# Patient Record
Sex: Female | Born: 1950 | Race: White | Hispanic: No | Marital: Married | Smoking: Current every day smoker
Health system: Southern US, Community
[De-identification: ages and names within clinical notes are randomized; demographics above are authoritative.]

## PROBLEM LIST (undated history)

## (undated) DIAGNOSIS — C73 Malignant neoplasm of thyroid gland: Secondary | ICD-10-CM

## (undated) DIAGNOSIS — Z720 Tobacco use: Secondary | ICD-10-CM

## (undated) DIAGNOSIS — F1010 Alcohol abuse, uncomplicated: Secondary | ICD-10-CM

## (undated) DIAGNOSIS — E039 Hypothyroidism, unspecified: Secondary | ICD-10-CM

## (undated) DIAGNOSIS — J449 Chronic obstructive pulmonary disease, unspecified: Secondary | ICD-10-CM

## (undated) HISTORY — DX: Chronic obstructive pulmonary disease, unspecified: J44.9

## (undated) HISTORY — PX: ABDOMINAL HYSTERECTOMY: SHX81

## (undated) HISTORY — PX: THYROID SURGERY: SHX805

---

## 2001-05-10 ENCOUNTER — Other Ambulatory Visit: Admission: RE | Admit: 2001-05-10 | Discharge: 2001-05-10 | Payer: Self-pay | Admitting: General Surgery

## 2001-12-12 ENCOUNTER — Encounter (INDEPENDENT_AMBULATORY_CARE_PROVIDER_SITE_OTHER): Payer: Self-pay | Admitting: *Deleted

## 2001-12-12 ENCOUNTER — Ambulatory Visit (HOSPITAL_BASED_OUTPATIENT_CLINIC_OR_DEPARTMENT_OTHER): Admission: RE | Admit: 2001-12-12 | Discharge: 2001-12-12 | Payer: Self-pay | Admitting: Orthopedic Surgery

## 2003-08-15 ENCOUNTER — Emergency Department (HOSPITAL_COMMUNITY): Admission: EM | Admit: 2003-08-15 | Discharge: 2003-08-15 | Payer: Self-pay | Admitting: Emergency Medicine

## 2004-01-08 ENCOUNTER — Ambulatory Visit (HOSPITAL_COMMUNITY): Admission: RE | Admit: 2004-01-08 | Discharge: 2004-01-08 | Payer: Self-pay | Admitting: Obstetrics & Gynecology

## 2005-05-05 ENCOUNTER — Ambulatory Visit (HOSPITAL_COMMUNITY): Admission: RE | Admit: 2005-05-05 | Discharge: 2005-05-05 | Payer: Self-pay | Admitting: Obstetrics & Gynecology

## 2005-06-29 ENCOUNTER — Ambulatory Visit (HOSPITAL_COMMUNITY): Admission: RE | Admit: 2005-06-29 | Discharge: 2005-06-29 | Payer: Self-pay | Admitting: Obstetrics & Gynecology

## 2007-04-06 IMAGING — US US BREAST*L*
1 series · 4 of 4 positions shown · non-contrast
Comparison: none

UNILATERAL  LEFT DIAGNOSTIC MAMMOGRAM AND LEFT BREAST ULTRASOUND:

Spot compression views of the lower outer quadrant of the left breast was obtained.  There is 
persistence of an obscured mass.  There is no associated malignant type microcalcifications.
On physical exam, I do not palpate a mass in the left breast.  Sonographically, a simple-appearing 
7 x 7 x 5 mm cyst is imaged at 5 o'clock approximately 4 cm from the nipple.  No solid mass or 
abnormal shadowing.

[Series 1: unknown · 0.05mm/px · 4 of 4 slices shown]
[im 1/4]
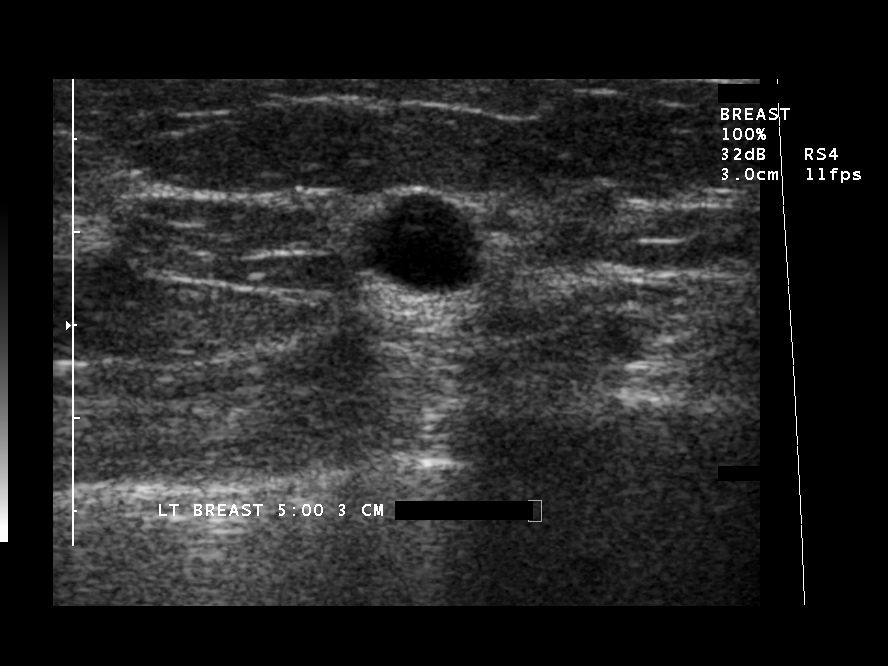
[im 2/4]
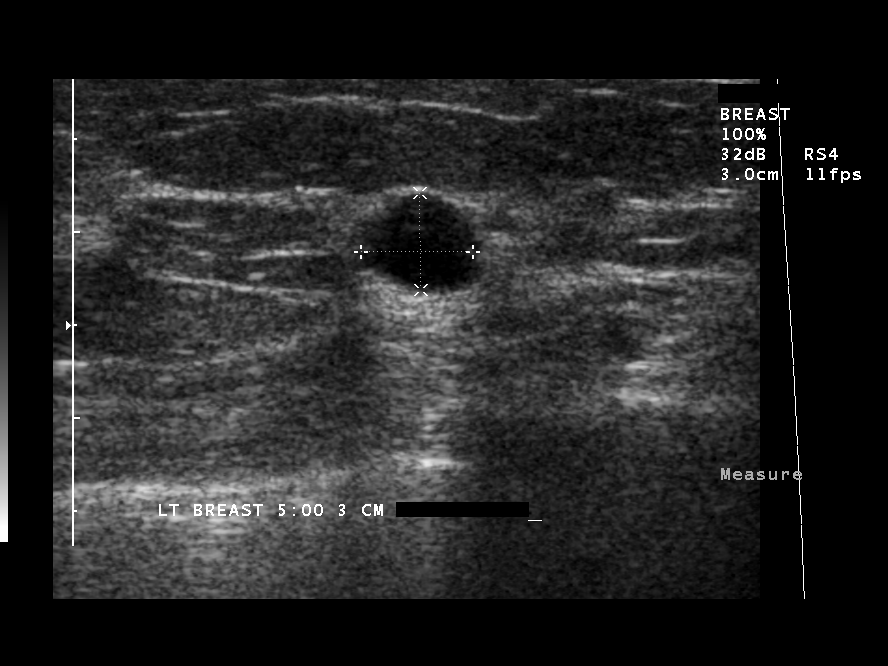
[im 3/4]
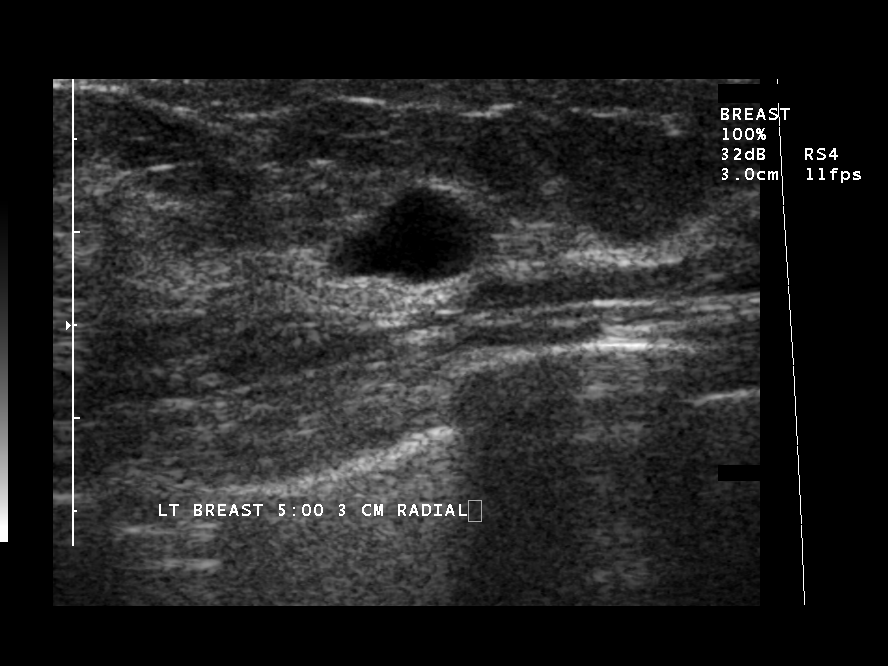
[im 4/4]
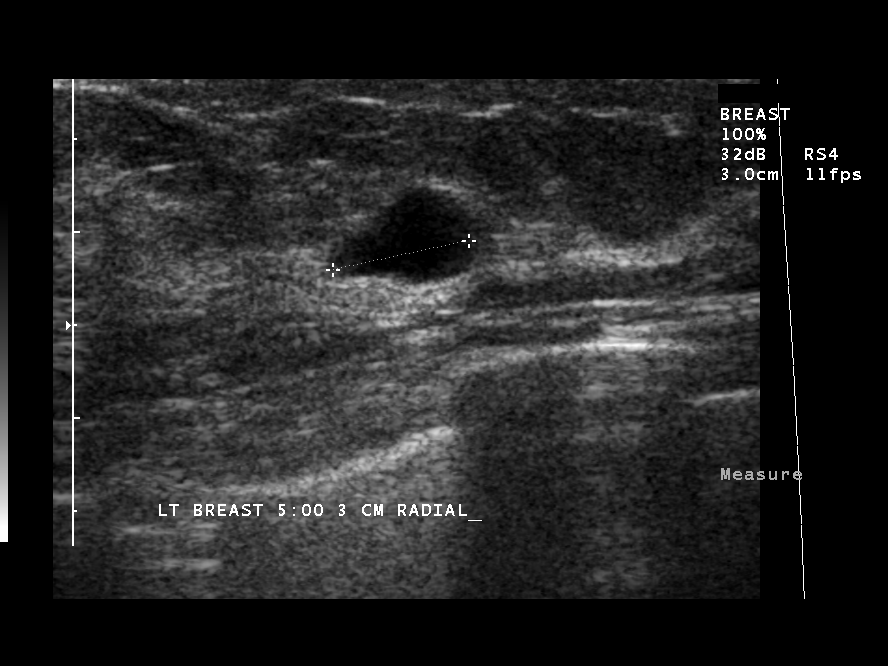

[4 of 4 positions shown; findings below may reference images not displayed]

IMPRESSION: Left breast cysts.  No evidence of malignancy.  Screening mammogram in one year is recommended.

ASSESSMENT: Benign - BI-RADS 2

Screening mammogram of both breasts in 1 year.

## 2010-10-11 ENCOUNTER — Encounter: Payer: Self-pay | Admitting: Obstetrics & Gynecology

## 2011-02-04 NOTE — Op Note (Signed)
. [facility]  Patient:    Leslie Drake, Leslie Drake Visit Number: [id number] MRN: [id number]          Service Type: DSU Location: [facility] Attending Physician:  Marlowe Shores Dictated by:   Artist Pais Mina Marble, M.D. Proc. Date: 12/12/01 Admit Date:  12/12/2001                             Operative Report  PREOPERATIVE DIAGNOSIS:  Mass palmar aspect left hand.  POSTOPERATIVE DIAGNOSIS:  Mass palmar aspect left hand.  OPERATION:  Excisional biopsy mass palmar aspect left hand.  SURGEON:  Artist Pais. Mina Marble, M.D.  ASSISTANT:  Junius Roads. Ireton, P.A.C.  INDICATION:  Beer block.  TOURNIQUET TIME:  30 minutes.  COMPLICATIONS:  None.  DRAINS:  None.  DESCRIPTION OF PROCEDURE:  The patient was taken to the operating room after the induction of beer block analgesia.  Her left upper extremity was prepped and draped in the usual sterile fashion.  Once this was done, a broom-type incision was made over the metacarpal of the long finger.  This was taken down through the skin and subcutaneous tissue.  What appeared to be a Dupuytrens nodules was encountered.  It was carefully dissected free from underlying soft tissues and sent for pathologic confirmation.  The wound was irrigated.  Hemostasis was achieved with bipolar cautery.  The wound was closed loose with a 5-0 nylon.  The patient was placed in a sterile dressing, Xeroform, 4 by 4s, fluffs, and a compressive wrap.  The patient tolerated the procedure well and went to the recovery room in stable condition. Dictated by:   Artist Pais Mina Marble, M.D. Attending Physician:  Marlowe Shores DD:  12/12/01 TD:  12/12/01 Job: [id number]

## 2012-05-14 ENCOUNTER — Telehealth: Payer: Self-pay | Admitting: Family Medicine

## 2012-05-14 ENCOUNTER — Encounter: Payer: Self-pay | Admitting: Family Medicine

## 2012-05-14 ENCOUNTER — Ambulatory Visit (INDEPENDENT_AMBULATORY_CARE_PROVIDER_SITE_OTHER): Payer: BC Managed Care – PPO | Admitting: Family Medicine

## 2012-05-14 VITALS — BP 118/84 | HR 88 | Resp 22 | Ht 64.25 in | Wt 124.1 lb

## 2012-05-14 DIAGNOSIS — N951 Menopausal and female climacteric states: Secondary | ICD-10-CM

## 2012-05-14 DIAGNOSIS — R0902 Hypoxemia: Secondary | ICD-10-CM | POA: Insufficient documentation

## 2012-05-14 DIAGNOSIS — Z13220 Encounter for screening for lipoid disorders: Secondary | ICD-10-CM

## 2012-05-14 DIAGNOSIS — R0602 Shortness of breath: Secondary | ICD-10-CM

## 2012-05-14 DIAGNOSIS — Z780 Asymptomatic menopausal state: Secondary | ICD-10-CM

## 2012-05-14 DIAGNOSIS — E039 Hypothyroidism, unspecified: Secondary | ICD-10-CM | POA: Insufficient documentation

## 2012-05-14 DIAGNOSIS — Z720 Tobacco use: Secondary | ICD-10-CM

## 2012-05-14 DIAGNOSIS — F17200 Nicotine dependence, unspecified, uncomplicated: Secondary | ICD-10-CM

## 2012-05-14 MED ORDER — PREDNISONE 10 MG PO TABS: ORAL_TABLET | ORAL | Status: DC

## 2012-05-14 MED ORDER — ALBUTEROL SULFATE (5 MG/ML) 0.5% IN NEBU
2.5000 mg | INHALATION_SOLUTION | Freq: Once | RESPIRATORY_TRACT | Status: AC
  Administered 2012-05-14: 2.5 mg via RESPIRATORY_TRACT

## 2012-05-14 MED ORDER — ALBUTEROL SULFATE HFA 108 (90 BASE) MCG/ACT IN AERS: 2.0000 | INHALATION_SPRAY | RESPIRATORY_TRACT | Status: DC | PRN

## 2012-05-14 NOTE — Patient Instructions (Signed)
You will be called with appointment for your pulmonary function test, get the chest xray the same day Get the labs done, we will discuss at the next appt Home oxygen on 2 L Work on the smoking! F/U 2 weeks

## 2012-05-14 NOTE — Assessment & Plan Note (Signed)
Best oxygen sat 93% RA

## 2012-05-14 NOTE — Telephone Encounter (Signed)
Done

## 2012-05-14 NOTE — Progress Notes (Signed)
  Subjective:    Patient ID: Leslie Drake, female    DOB: 1950-12-28, 61 y.o.   MRN: [id number]  HPI Pt presents to establish care, she has not had a primary care doctor in many years. No medications History reviewed She has had difficulty breathing for the past 3 years and it has worsened. She smokes 1ppd. Her family members also smoke  She has cough and wheeze which worsen at night, she used her husbands inhaler once which helped her. She is unable to stand for periods of time due to fatigue and SOB, she is  Unable to walk long distances without severe fatigue and SOB.  She also has a history of  Remote partial thyroidectomy and was to be on replacement hormone but she did not tolerate the medication ?? >  therefore did not continue    Review of Systems  GEN- denies fatigue, fever, weight loss,weakness, recent illness HEENT- denies eye drainage, change in vision, nasal discharge, CVS- denies chest pain, palpitations RESP- + SOB, cough, wheeze ABD- denies N/V, change in stools, abd pain GU- denies dysuria, hematuria, dribbling, incontinence MSK- denies joint pain, muscle aches, injury Neuro- denies headache, dizziness, syncope, seizure activity      Objective:   Physical Exam GEN- NAD, alert and oriented x3, mild respiratory distress sitting  HEENT- PERRL, EOMI, non injected sclera, pink conjunctiva, MMM, oropharynx clear, brown stain to tongue Neck- Supple, no LAD CVS- RRR, no murmur RESP-decreased BS bases, retractions, increased WOB with little exertion, few scattered wheeze, unable to ambulate without hypoxia, retractions  ABD-NABS,soft,NT,ND  EXT- No edema Pulses- Radial, DP- 2+ Psych-normal affect and mood       Assessment & Plan:

## 2012-05-14 NOTE — Assessment & Plan Note (Signed)
I am concerned for COPD/Emphysema based on her presentsation, oxygen sat was very difficult to pick up on pt initially in office, with very little exertion she has increased pursed lip breathing  Given neb treatment IM steroids , followed by steroid burst Home oxygen  CXR to be done, will need PFT Labs

## 2012-05-15 ENCOUNTER — Other Ambulatory Visit: Payer: Self-pay | Admitting: Family Medicine

## 2012-05-15 DIAGNOSIS — N644 Mastodynia: Secondary | ICD-10-CM

## 2012-05-15 LAB — COMPREHENSIVE METABOLIC PANEL
ALT: 13 U/L (ref 0–35)
AST: 20 U/L (ref 0–37)
Albumin: 4.2 g/dL (ref 3.5–5.2)
Alkaline Phosphatase: 149 U/L — ABNORMAL HIGH (ref 39–117)
BUN: 10 mg/dL (ref 6–23)
CO2: 28 mEq/L (ref 19–32)
Calcium: 10.2 mg/dL (ref 8.4–10.5)
Chloride: 96 mEq/L (ref 96–112)
Creat: 0.59 mg/dL (ref 0.50–1.10)
Glucose, Bld: 100 mg/dL — ABNORMAL HIGH (ref 70–99)
Potassium: 6.3 mEq/L (ref 3.5–5.3)
Sodium: 138 mEq/L (ref 135–145)
Total Bilirubin: 0.3 mg/dL (ref 0.3–1.2)
Total Protein: 7.6 g/dL (ref 6.0–8.3)

## 2012-05-15 LAB — CBC WITH DIFFERENTIAL/PLATELET
Basophils Absolute: 0.1 10*3/uL (ref 0.0–0.1)
Basophils Relative: 1 % (ref 0–1)
Eosinophils Absolute: 0.1 10*3/uL (ref 0.0–0.7)
Eosinophils Relative: 1 % (ref 0–5)
HCT: 44.4 % (ref 36.0–46.0)
Hemoglobin: 15.1 g/dL — ABNORMAL HIGH (ref 12.0–15.0)
Lymphocytes Relative: 23 % (ref 12–46)
Lymphs Abs: 2.4 10*3/uL (ref 0.7–4.0)
MCH: 31.7 pg (ref 26.0–34.0)
MCHC: 34 g/dL (ref 30.0–36.0)
MCV: 93.3 fL (ref 78.0–100.0)
Monocytes Absolute: 0.7 10*3/uL (ref 0.1–1.0)
Monocytes Relative: 7 % (ref 3–12)
Neutro Abs: 7.2 10*3/uL (ref 1.7–7.7)
Neutrophils Relative %: 68 % (ref 43–77)
Platelets: 577 10*3/uL — ABNORMAL HIGH (ref 150–400)
RBC: 4.76 MIL/uL (ref 3.87–5.11)
RDW: 13.9 % (ref 11.5–15.5)
WBC: 10.6 10*3/uL — ABNORMAL HIGH (ref 4.0–10.5)

## 2012-05-15 LAB — LIPID PANEL
Cholesterol: 239 mg/dL — ABNORMAL HIGH (ref 0–200)
HDL: 83 mg/dL (ref >39)
LDL Cholesterol: 135 mg/dL — ABNORMAL HIGH (ref 0–99)
Total CHOL/HDL Ratio: 2.9 Ratio
Triglycerides: 106 mg/dL (ref <150)
VLDL: 21 mg/dL (ref 0–40)

## 2012-05-15 LAB — T4: T4, Total: 8.1 ug/dL (ref 5.0–12.5)

## 2012-05-15 LAB — T3, FREE: T3, Free: 2.4 pg/mL (ref 2.3–4.2)

## 2012-05-15 LAB — TSH: TSH: 2.235 u[IU]/mL (ref 0.350–4.500)

## 2012-05-15 MED ORDER — SODIUM POLYSTYRENE SULFONATE PO POWD: Freq: Once | ORAL | Status: AC

## 2012-05-17 LAB — VITAMIN D 1,25 DIHYDROXY
Vitamin D 1, 25 (OH)2 Total: 68 pg/mL (ref 18–72)
Vitamin D2 1, 25 (OH)2: <8 pg/mL
Vitamin D3 1, 25 (OH)2: 68 pg/mL

## 2012-05-18 NOTE — Assessment & Plan Note (Signed)
Counseled on smoking cessation  

## 2012-05-18 NOTE — Assessment & Plan Note (Signed)
History of partial thyroidectomy, check TFT with other baseline labs

## 2012-05-23 ENCOUNTER — Ambulatory Visit (HOSPITAL_COMMUNITY)
Admission: RE | Admit: 2012-05-23 | Discharge: 2012-05-23 | Disposition: A | Discharge status: Home / Self Care | Payer: BC Managed Care – PPO | Source: Ambulatory Visit | Attending: Family Medicine | Admitting: Family Medicine

## 2012-05-23 DIAGNOSIS — R0602 Shortness of breath: Secondary | ICD-10-CM

## 2012-05-23 DIAGNOSIS — N644 Mastodynia: Secondary | ICD-10-CM | POA: Insufficient documentation

## 2012-05-28 ENCOUNTER — Ambulatory Visit (INDEPENDENT_AMBULATORY_CARE_PROVIDER_SITE_OTHER): Payer: BC Managed Care – PPO | Admitting: Family Medicine

## 2012-05-28 ENCOUNTER — Encounter: Payer: Self-pay | Admitting: Family Medicine

## 2012-05-28 ENCOUNTER — Other Ambulatory Visit: Payer: Self-pay | Admitting: Family Medicine

## 2012-05-28 VITALS — BP 94/68 | HR 92 | Resp 18 | Ht 64.25 in | Wt 128.0 lb

## 2012-05-28 DIAGNOSIS — D75839 Thrombocytosis, unspecified: Secondary | ICD-10-CM | POA: Insufficient documentation

## 2012-05-28 DIAGNOSIS — D473 Essential (hemorrhagic) thrombocythemia: Secondary | ICD-10-CM

## 2012-05-28 DIAGNOSIS — R0600 Dyspnea, unspecified: Secondary | ICD-10-CM

## 2012-05-28 DIAGNOSIS — J449 Chronic obstructive pulmonary disease, unspecified: Secondary | ICD-10-CM

## 2012-05-28 DIAGNOSIS — R928 Other abnormal and inconclusive findings on diagnostic imaging of breast: Secondary | ICD-10-CM | POA: Insufficient documentation

## 2012-05-28 DIAGNOSIS — F17200 Nicotine dependence, unspecified, uncomplicated: Secondary | ICD-10-CM

## 2012-05-28 DIAGNOSIS — E039 Hypothyroidism, unspecified: Secondary | ICD-10-CM

## 2012-05-28 DIAGNOSIS — E875 Hyperkalemia: Secondary | ICD-10-CM

## 2012-05-28 DIAGNOSIS — Z720 Tobacco use: Secondary | ICD-10-CM

## 2012-05-28 LAB — CBC
HCT: 42.3 % (ref 36.0–46.0)
Hemoglobin: 14.4 g/dL (ref 12.0–15.0)
MCH: 32 pg (ref 26.0–34.0)
MCHC: 34 g/dL (ref 30.0–36.0)
MCV: 94 fL (ref 78.0–100.0)
Platelets: 423 10*3/uL — ABNORMAL HIGH (ref 150–400)
RBC: 4.5 MIL/uL (ref 3.87–5.11)
RDW: 13.7 % (ref 11.5–15.5)
WBC: 7.7 10*3/uL (ref 4.0–10.5)

## 2012-05-28 MED ORDER — FLUTICASONE-SALMETEROL 250-50 MCG/DOSE IN AEPB: 1.0000 | INHALATION_SPRAY | Freq: Two times a day (BID) | RESPIRATORY_TRACT | Status: DC

## 2012-05-28 NOTE — Assessment & Plan Note (Signed)
Labs look good, no meds needed

## 2012-05-28 NOTE — Patient Instructions (Addendum)
PFT to be done- please schedule mid-morning Start the advair twice a day  Do not use the albuterol the day of the test  Get the labs done today for potassium  Pick up handicap placard  F/U 6 weeks

## 2012-05-28 NOTE — Assessment & Plan Note (Signed)
Benign appearing calcifications in left breast, repeat in 6 months

## 2012-05-28 NOTE — Progress Notes (Signed)
  Subjective:    Patient ID: Leslie Drake, female    DOB: [birth year], 61 y.o.   MRN: [id number]  HPI Patient here for interim visit for her COPD. Labs also discussed Her initial visit patient was found to be hypoxic with severe shortness of breath with minimal exertion. She was started on 2 L of oxygen 24 hours a day. She's cut back her cigarette smoking to 7 cigarettes a day the use of an electronic cigarette. She is using her albuterol inhaler 4 times a day but states she feels much improved and is able to do some housework and get around the house without problems. She uses the portable oxygen when out of the home. Chest x-ray was obtained which was concerning for COPD findings as well as a possible early infiltrate however she denies cough with production, fever, chills or feeling ill therefore no medication was started.   Review of Systems  GEN- denies fatigue, fever, weight loss,weakness, recent illness HEENT- denies eye drainage, change in vision, nasal discharge, CVS- denies chest pain, palpitations RESP- + SOB, cough, wheeze ABD- denies N/V, change in stools, abd pain GU- denies dysuria, hematuria, dribbling, incontinence MSK- denies joint pain, muscle aches, injury Neuro- denies headache, dizziness, syncope, seizure activity      Objective:   Physical Exam GEN- NAD, alert and oriented x3, mild respiratory distress sitting  HEENT- PERRL, EOMI, non injected sclera, pink conjunctiva, MMM, oropharynx clear, CVS- RRR, no murmur RESP-decreased BS bases,increased WOB without OXygen, appears comfortable with oxygen, scattered wheeze, no rhonchi, no retractions  EXT- No edema Pulses- Radial 2+         Assessment & Plan:

## 2012-05-28 NOTE — Assessment & Plan Note (Signed)
S/p kayexlate, recheck today, normal renal function, possible to due to her respiratory status leading to an acidosis

## 2012-05-28 NOTE — Assessment & Plan Note (Signed)
Congratulated on cutting back on tobacco use

## 2012-05-28 NOTE — Assessment & Plan Note (Signed)
Breathing much improved with oxygen, she looks overall more comfortable with a more energy. PFT to be done, add advair, did well on prednisone and albuterol

## 2012-05-28 NOTE — Assessment & Plan Note (Signed)
Mild thrombocytosis, likely due to the inflammatory states with her untreated COPD. Repeat CBC

## 2012-05-29 LAB — BASIC METABOLIC PANEL
BUN: 10 mg/dL (ref 6–23)
CO2: 30 mEq/L (ref 19–32)
Calcium: 9.8 mg/dL (ref 8.4–10.5)
Chloride: 101 mEq/L (ref 96–112)
Creat: 0.54 mg/dL (ref 0.50–1.10)
Glucose, Bld: 83 mg/dL (ref 70–99)
Potassium: 4.8 mEq/L (ref 3.5–5.3)
Sodium: 140 mEq/L (ref 135–145)

## 2012-06-04 NOTE — Progress Notes (Signed)
Pt has appt for pft for 06/21/2012 9:30. Called and left message

## 2012-06-21 ENCOUNTER — Ambulatory Visit (HOSPITAL_COMMUNITY): Admission: RE | Admit: 2012-06-21 | Payer: BC Managed Care – PPO | Source: Ambulatory Visit

## 2012-07-02 ENCOUNTER — Telehealth: Payer: Self-pay | Admitting: Family Medicine

## 2012-07-05 ENCOUNTER — Ambulatory Visit (HOSPITAL_COMMUNITY): Admission: RE | Admit: 2012-07-05 | Payer: BC Managed Care – PPO | Source: Ambulatory Visit

## 2012-07-05 ENCOUNTER — Ambulatory Visit: Payer: BC Managed Care – PPO | Admitting: Family Medicine

## 2012-07-06 NOTE — Telephone Encounter (Signed)
Patient just wanted to let MD know that she had to reschedule appt due to sickness of spouse.

## 2012-07-30 ENCOUNTER — Ambulatory Visit: Payer: BC Managed Care – PPO | Admitting: Family Medicine

## 2012-07-31 ENCOUNTER — Ambulatory Visit (HOSPITAL_COMMUNITY): Admission: RE | Admit: 2012-07-31 | Payer: BC Managed Care – PPO | Source: Ambulatory Visit

## 2012-07-31 ENCOUNTER — Other Ambulatory Visit: Payer: Self-pay | Admitting: Family Medicine

## 2012-10-02 ENCOUNTER — Other Ambulatory Visit: Payer: Self-pay | Admitting: Family Medicine

## 2012-10-03 ENCOUNTER — Ambulatory Visit (HOSPITAL_COMMUNITY)
Admission: RE | Admit: 2012-10-03 | Discharge: 2012-10-03 | Disposition: A | Discharge status: Home / Self Care | Payer: BC Managed Care – PPO | Source: Ambulatory Visit | Attending: Family Medicine | Admitting: Family Medicine

## 2012-10-03 DIAGNOSIS — Z720 Tobacco use: Secondary | ICD-10-CM

## 2012-10-03 DIAGNOSIS — R0989 Other specified symptoms and signs involving the circulatory and respiratory systems: Secondary | ICD-10-CM | POA: Insufficient documentation

## 2012-10-03 DIAGNOSIS — R0609 Other forms of dyspnea: Secondary | ICD-10-CM | POA: Insufficient documentation

## 2012-10-03 DIAGNOSIS — R0600 Dyspnea, unspecified: Secondary | ICD-10-CM

## 2012-10-03 DIAGNOSIS — F17200 Nicotine dependence, unspecified, uncomplicated: Secondary | ICD-10-CM | POA: Insufficient documentation

## 2012-10-03 MED ORDER — ALBUTEROL SULFATE (5 MG/ML) 0.5% IN NEBU
2.5000 mg | INHALATION_SOLUTION | Freq: Once | RESPIRATORY_TRACT | Status: AC
  Administered 2012-10-03: 2.5 mg via RESPIRATORY_TRACT

## 2012-10-04 ENCOUNTER — Encounter: Payer: Self-pay | Admitting: Family Medicine

## 2012-10-04 ENCOUNTER — Ambulatory Visit (INDEPENDENT_AMBULATORY_CARE_PROVIDER_SITE_OTHER): Payer: BC Managed Care – PPO | Admitting: Family Medicine

## 2012-10-04 VITALS — BP 116/70 | HR 85 | Resp 20 | Ht 64.25 in | Wt 135.0 lb

## 2012-10-04 DIAGNOSIS — J449 Chronic obstructive pulmonary disease, unspecified: Secondary | ICD-10-CM

## 2012-10-04 DIAGNOSIS — Z720 Tobacco use: Secondary | ICD-10-CM

## 2012-10-04 DIAGNOSIS — R920 Mammographic microcalcification found on diagnostic imaging of breast: Secondary | ICD-10-CM

## 2012-10-04 DIAGNOSIS — F17200 Nicotine dependence, unspecified, uncomplicated: Secondary | ICD-10-CM

## 2012-10-04 DIAGNOSIS — R928 Other abnormal and inconclusive findings on diagnostic imaging of breast: Secondary | ICD-10-CM

## 2012-10-04 MED ORDER — TIOTROPIUM BROMIDE MONOHYDRATE 18 MCG IN CAPS: 18.0000 ug | ORAL_CAPSULE | Freq: Every day | RESPIRATORY_TRACT | Status: DC

## 2012-10-04 MED ORDER — BUPROPION HCL ER (SMOKING DET) 150 MG PO TB12: 150.0000 mg | ORAL_TABLET | Freq: Two times a day (BID) | ORAL | Status: DC

## 2012-10-04 MED ORDER — ALBUTEROL SULFATE HFA 108 (90 BASE) MCG/ACT IN AERS: 2.0000 | INHALATION_SPRAY | RESPIRATORY_TRACT | Status: DC | PRN

## 2012-10-04 NOTE — Procedures (Signed)
NAMECONCETTA, GUION             ACCOUNT NO.:  [id number]  MEDICAL RECORD NO.:  [id number]  LOCATION:  RESP                          FACILITY:  [facility]  PHYSICIAN:  Bridget Priceville L. Juanetta Gosling, M.D.DATE OF BIRTH:  October 01, 1950  DATE OF PROCEDURE: DATE OF DISCHARGE:  10/03/2012                           PULMONARY FUNCTION TEST   REASON FOR PULMONARY FUNCTION TESTING:  Dyspnea.  1. Spirometry shows a severe ventilatory defect with airflow     obstruction. 2. Lung volumes show air trapping. 3. DLCO is severely reduced, but does correct somewhat when     ventilation is accounted for. 4. Airway resistance is elevated confirming the presence of airflow     obstruction. 5. There is no significant bronchodilator improvement. 6. This study is consistent with COPD.     Taitum Menton L. Juanetta Gosling, M.D.     EL[facility]/MEDQ  D:  10/03/2012  T:  10/04/2012  Job:  [id number]  cc:   Milinda Antis, MD

## 2012-10-04 NOTE — Patient Instructions (Addendum)
Mammogram to be scheduled  Start Spiriva once a day  Albuterol as needed Zyban for the smoking twice a day  Pneumonia shot given  F/U 2 months

## 2012-10-05 ENCOUNTER — Encounter: Payer: Self-pay | Admitting: Family Medicine

## 2012-10-05 NOTE — Progress Notes (Signed)
  Subjective:    Patient ID: Leslie Drake, female    DOB: 04/19/1951, 62 y.o.   MRN: [id number]  HPI Patient here to followup COPD she had a PFT done yesterday which showed severe obstruction. There was not a lot of response to bronchodilator. She is using her oxygen on 2 L and feels this has helped her greatly. She was prescribed Advair however stopped this after 4 days because he gave her coughing attacks and spasms and cough sore throat. Therefore she's only been using albuterol and uses this every 4 hours. She is due for pneumonia vaccine. She continues to smoke but is ready to quit in the past she was on Wellbutrin and see she did well with this but this was greater than 10 years ago she is willing to try again.  Due for diagnostic left mammogram secondary to calcification seen 6 months ago   Review of Systems - per above   GEN- denies fatigue, fever, weight loss,weakness, recent illness HEENT- denies eye drainage, change in vision, nasal discharge, CVS- denies chest pain, palpitations RESP- +SOB w/ exertion, cough, +wheeze ABD- denies N/V, change in stools, abd pain GU- denies dysuria, hematuria, dribbling, incontinence MSK- denies joint pain, muscle aches, injury Neuro- denies headache, dizziness, syncope, seizure activity      Objective:   Physical Exam GEN- NAD, alert and oriented x3 HEENT- PERRL, EOMI, non injected sclera, pink conjunctiva, MMM, oropharynx clear Neck- Supple, no LAD CVS- RRR, no murmur RESP-bilateral wheeze, normal WOb with oxygen, no rhonchi, good air movement EXT- No edema Pulses- Radial, DP- 2+        Assessment & Plan:

## 2012-10-05 NOTE — Assessment & Plan Note (Signed)
Trial of Zyban, patient is ready to quit

## 2012-10-05 NOTE — Assessment & Plan Note (Signed)
Plan to repeat mammogram ?

## 2012-10-05 NOTE — Assessment & Plan Note (Signed)
Will add Spiriva and discontinue the Advair since she had intolerance of this. She will continue the albuterol as needed. Continue oxygen therapy. Discussed importance of smoking cessation and how this will affect the course of her disease.

## 2012-10-17 ENCOUNTER — Encounter: Payer: Self-pay | Admitting: Family Medicine

## 2012-10-17 DIAGNOSIS — J439 Emphysema, unspecified: Secondary | ICD-10-CM | POA: Insufficient documentation

## 2012-10-17 LAB — PULMONARY FUNCTION TEST

## 2012-11-07 ENCOUNTER — Encounter (HOSPITAL_COMMUNITY): Payer: BC Managed Care – PPO

## 2012-12-06 ENCOUNTER — Ambulatory Visit: Payer: BC Managed Care – PPO | Admitting: Family Medicine

## 2012-12-10 ENCOUNTER — Ambulatory Visit: Payer: BC Managed Care – PPO | Admitting: Family Medicine

## 2012-12-20 ENCOUNTER — Ambulatory Visit: Payer: BC Managed Care – PPO | Admitting: Family Medicine

## 2012-12-24 ENCOUNTER — Encounter: Payer: Self-pay | Admitting: Family Medicine

## 2012-12-24 ENCOUNTER — Ambulatory Visit (INDEPENDENT_AMBULATORY_CARE_PROVIDER_SITE_OTHER): Payer: BC Managed Care – PPO | Admitting: Family Medicine

## 2012-12-24 VITALS — BP 102/64 | HR 95 | Resp 20 | Ht 64.25 in | Wt 131.1 lb

## 2012-12-24 DIAGNOSIS — F17200 Nicotine dependence, unspecified, uncomplicated: Secondary | ICD-10-CM

## 2012-12-24 DIAGNOSIS — Z720 Tobacco use: Secondary | ICD-10-CM

## 2012-12-24 DIAGNOSIS — J3489 Other specified disorders of nose and nasal sinuses: Secondary | ICD-10-CM

## 2012-12-24 DIAGNOSIS — J439 Emphysema, unspecified: Secondary | ICD-10-CM

## 2012-12-24 DIAGNOSIS — J438 Other emphysema: Secondary | ICD-10-CM

## 2012-12-24 DIAGNOSIS — R0981 Nasal congestion: Secondary | ICD-10-CM

## 2012-12-24 MED ORDER — ALBUTEROL SULFATE (2.5 MG/3ML) 0.083% IN NEBU: 2.5000 mg | INHALATION_SOLUTION | RESPIRATORY_TRACT | Status: DC | PRN

## 2012-12-24 MED ORDER — ARFORMOTEROL TARTRATE 15 MCG/2ML IN NEBU: 15.0000 ug | INHALATION_SOLUTION | Freq: Two times a day (BID) | RESPIRATORY_TRACT | Status: DC

## 2012-12-24 NOTE — Progress Notes (Signed)
  Subjective:    Patient ID: Leslie Drake, female    DOB: 01/09/1951, 62 y.o.   MRN: [id number]  HPI  Patient here to followup COPD. She continues to have spells of shortness of breath and wheezing her oxygen is helping however she has not seen much benefit with his breathing for. She's albuterol about 4 times a day. She's unable to tolerate the inhaled corticosteroids secondary to severe coughing and shortness of breath after inhalations. She is unable to afford to go to pulmonologist She is due to have repeat mammogram of the left breast for diagnostic purposes.   Review of Systems  GEN- denies fatigue, fever, weight loss,weakness, recent illness HEENT- denies eye drainage, change in vision, nasal discharge, CVS- denies chest pain, palpitations RESP- + SOB, cough, wheeze ABD- denies N/V, change in stools, abd pain GU- denies dysuria, hematuria, dribbling, incontinence MSK- denies joint pain, muscle aches, injury Neuro- denies headache, dizziness, syncope, seizure activity      Objective:   Physical Exam GEN- NAD, alert and oriented x3 HEENT- PERRL, EOMI, non injected sclera, pink conjunctiva, MMM, oropharynx clear, TM clear bilat no effusion, dry in nares Neck- Supple, no LAD CVS- RRR, no murmur RESP-CTAB but decreased at bases, wearing 2 L EXT- No edema Pulses- Radial, DP- 2+        Assessment & Plan:

## 2012-12-24 NOTE — Assessment & Plan Note (Signed)
Continues to decrease use now down to 4cig /day

## 2012-12-24 NOTE — Patient Instructions (Addendum)
Try  A sudafed to decongest and continue nasal saline Use the vaseline for your nose  Labs at next visit I will follow-up mammogram We will call with instructions about your COPD medications Continue to work on smoking! F/U 5 months

## 2012-12-24 NOTE — Assessment & Plan Note (Addendum)
Spoke with pulmonology by phone. Due to her reaction to the inhale with a steroid wheeze a long acting bronchodilator called Brovana twice a day. She will continue the Spiriva and cholinergic. Continue oxygen therapy

## 2012-12-24 NOTE — Assessment & Plan Note (Signed)
Advised vaseline to nares Sudafed if used sparingly Humidified air   She tells me today that she is unable to come into regular visit more often secondary to cost and transportation. She's unable to see a pulmonary specialist at this time. I will continue to work with her.

## 2012-12-26 ENCOUNTER — Encounter (HOSPITAL_COMMUNITY): Payer: BC Managed Care – PPO

## 2013-01-15 ENCOUNTER — Encounter: Payer: Self-pay | Admitting: *Deleted

## 2013-02-27 ENCOUNTER — Other Ambulatory Visit: Payer: Self-pay | Admitting: Family Medicine

## 2013-04-02 ENCOUNTER — Ambulatory Visit (INDEPENDENT_AMBULATORY_CARE_PROVIDER_SITE_OTHER): Payer: BC Managed Care – PPO | Admitting: Family Medicine

## 2013-04-02 ENCOUNTER — Encounter: Payer: Self-pay | Admitting: Family Medicine

## 2013-04-02 VITALS — BP 100/60 | HR 78 | Temp 97.8°F | Resp 18 | Wt 137.0 lb

## 2013-04-02 DIAGNOSIS — R928 Other abnormal and inconclusive findings on diagnostic imaging of breast: Secondary | ICD-10-CM

## 2013-04-02 DIAGNOSIS — F17200 Nicotine dependence, unspecified, uncomplicated: Secondary | ICD-10-CM

## 2013-04-02 DIAGNOSIS — Z720 Tobacco use: Secondary | ICD-10-CM

## 2013-04-02 DIAGNOSIS — E785 Hyperlipidemia, unspecified: Secondary | ICD-10-CM

## 2013-04-02 DIAGNOSIS — E039 Hypothyroidism, unspecified: Secondary | ICD-10-CM

## 2013-04-02 DIAGNOSIS — J439 Emphysema, unspecified: Secondary | ICD-10-CM

## 2013-04-02 DIAGNOSIS — J438 Other emphysema: Secondary | ICD-10-CM

## 2013-04-02 LAB — CBC WITH DIFFERENTIAL/PLATELET
Basophils Absolute: 0.1 10*3/uL (ref 0.0–0.1)
Basophils Relative: 1 % (ref 0–1)
Eosinophils Absolute: 0.2 10*3/uL (ref 0.0–0.7)
Eosinophils Relative: 3 % (ref 0–5)
HCT: 38 % (ref 36.0–46.0)
Hemoglobin: 13 g/dL (ref 12.0–15.0)
Lymphocytes Relative: 35 % (ref 12–46)
Lymphs Abs: 2.3 10*3/uL (ref 0.7–4.0)
MCH: 31 pg (ref 26.0–34.0)
MCHC: 34.2 g/dL (ref 30.0–36.0)
MCV: 90.7 fL (ref 78.0–100.0)
Monocytes Absolute: 0.4 10*3/uL (ref 0.1–1.0)
Monocytes Relative: 7 % (ref 3–12)
Neutro Abs: 3.6 10*3/uL (ref 1.7–7.7)
Neutrophils Relative %: 54 % (ref 43–77)
Platelets: 333 10*3/uL (ref 150–400)
RBC: 4.19 MIL/uL (ref 3.87–5.11)
RDW: 15.9 % — ABNORMAL HIGH (ref 11.5–15.5)
WBC: 6.5 10*3/uL (ref 4.0–10.5)

## 2013-04-02 LAB — COMPREHENSIVE METABOLIC PANEL
Alkaline Phosphatase: 111 U/L (ref 39–117)
Creat: 0.66 mg/dL (ref 0.50–1.10)
Glucose, Bld: 100 mg/dL — ABNORMAL HIGH (ref 70–99)
Sodium: 141 mEq/L (ref 135–145)
Total Bilirubin: 0.5 mg/dL (ref 0.3–1.2)
Total Protein: 7.2 g/dL (ref 6.0–8.3)

## 2013-04-02 LAB — LIPID PANEL
Cholesterol: 233 mg/dL — ABNORMAL HIGH (ref 0–200)
HDL: 99 mg/dL (ref >39)
LDL Cholesterol: 120 mg/dL — ABNORMAL HIGH (ref 0–99)
Total CHOL/HDL Ratio: 2.4 Ratio
Triglycerides: 69 mg/dL (ref <150)
VLDL: 14 mg/dL (ref 0–40)

## 2013-04-02 LAB — T3, FREE: T3, Free: 3.8 pg/mL (ref 2.3–4.2)

## 2013-04-02 LAB — TSH: TSH: 3.107 u[IU]/mL (ref 0.350–4.500)

## 2013-04-02 NOTE — Assessment & Plan Note (Signed)
Recheck FLP , no current meds

## 2013-04-02 NOTE — Progress Notes (Signed)
  Subjective:    Patient ID: Leslie Drake, female    DOB: 03/16/1951, 62 y.o.   MRN: [id number]  HPI  Patient here to follow chronic medical problems. She's been doing well with her COPD she is tolerating the birth on a nebulizer twice a day. She is trying to quit smoking she is down to 7 cigarettes a day after completing a 12 week course the Wellbutrin. She's wearing her oxygen as prescribed. She is trying to avoid triggers of her COPD such as dust and cleaning supplies and that heat.  She attempted to get her mammogram twice however was unable to cooperate with the techs for holding her breath since no one to get the images.  She is due for fasting labs for her thyroid and her cholesterol.  Review of Systems   GEN- denies fatigue, fever, weight loss,weakness, recent illness HEENT- denies eye drainage, change in vision, nasal discharge, CVS- denies chest pain, palpitations RESP- + SOB with exertion, cough, wheeze ABD- denies N/V, change in stools, abd pain GU- denies dysuria, hematuria, dribbling, incontinence MSK- denies joint pain, muscle aches, injury Neuro- denies headache, dizziness, syncope, seizure activity      Objective:   Physical Exam GEN- NAD, alert and oriented x3 HEENT- PERRL, EOMI, non injected sclera, pink conjunctiva, MMM, oropharynx clear Neck- Supple, no thryomegaly CVS- RRR, no murmur, HS a little distant RESP-decreased at bases, no wheeze, good air movement EXT- No edema Pulses- Radial, DP- 2+        Assessment & Plan:

## 2013-04-02 NOTE — Assessment & Plan Note (Signed)
Ongoing use, but down to 7 cig per day, with use of wellbutrin for 3 months, declines further treatment, she understands this is a long battle

## 2013-04-02 NOTE — Assessment & Plan Note (Addendum)
Doing well on Brovana and albuterol as needed Allergy to inhaled steroids We discussed course of COPD , quitting smoking will help her live an easier life with less excerbations, avoiding irritants to lungs, avoiding others that are sick

## 2013-04-02 NOTE — Assessment & Plan Note (Signed)
Discussed with radiology, Mammogram will be needed, unable to see calcifications with [place] or MRI of breast They will work with pt to get images needed

## 2013-04-02 NOTE — Assessment & Plan Note (Signed)
Did not tolerate thyroid replacement > 30 years ago, has thyroid tissue left, TFT normal last year Repeat today

## 2013-04-02 NOTE — Patient Instructions (Signed)
I will call about the mammogram  We will call with the lab results  Continue current medications F/U 4 months

## 2013-04-03 ENCOUNTER — Telehealth: Payer: Self-pay | Admitting: Family Medicine

## 2013-04-03 NOTE — Telephone Encounter (Signed)
I Spoke with pt, states she is unable to hold her breath for the mammogram I even discussed taking 1 pic at time, which radiology told me they can do but she declines, understands she may have breast cancer but declines I did speak with radiology at the breast center due to her abnormality [place] or MRI would not work, she needs mammogram Pt is aware she can call me if she changes her minds and wants to try to get Mammogram

## 2013-04-13 ENCOUNTER — Other Ambulatory Visit: Payer: Self-pay | Admitting: Family Medicine

## 2013-04-15 NOTE — Telephone Encounter (Signed)
Med refilled.

## 2013-05-14 ENCOUNTER — Other Ambulatory Visit: Payer: Self-pay | Admitting: Family Medicine

## 2013-05-15 ENCOUNTER — Other Ambulatory Visit: Payer: Self-pay | Admitting: Family Medicine

## 2013-05-15 ENCOUNTER — Telehealth: Payer: Self-pay | Admitting: Family Medicine

## 2013-05-15 MED ORDER — ALBUTEROL SULFATE (2.5 MG/3ML) 0.083% IN NEBU: 2.5000 mg | INHALATION_SOLUTION | RESPIRATORY_TRACT | Status: DC | PRN

## 2013-05-15 MED ORDER — ALBUTEROL SULFATE HFA 108 (90 BASE) MCG/ACT IN AERS: INHALATION_SPRAY | RESPIRATORY_TRACT | Status: DC

## 2013-05-15 NOTE — Telephone Encounter (Signed)
Meds refilled.

## 2013-05-15 NOTE — Telephone Encounter (Signed)
?   Ok to refill, did not see on her med list. IS she still on this?

## 2013-05-15 NOTE — Telephone Encounter (Signed)
Proair Inh (200 puffs) 8.5 gm dos ctr  Inhale 2 puffs q4 hours prn wheezing #8.5

## 2013-05-15 NOTE — Telephone Encounter (Signed)
Pt no longer taking.

## 2013-05-15 NOTE — Telephone Encounter (Signed)
Denied pt no longer taking this medication

## 2013-05-16 NOTE — Telephone Encounter (Signed)
Med DENIED, pt no longer on this medicatioin

## 2013-05-16 NOTE — Telephone Encounter (Signed)
I have already responded to this message She is no longer taking, call pharmacy and remove from list if needed,

## 2013-05-16 NOTE — Telephone Encounter (Signed)
Is pt still on this med, do not see on her med list or where she was prescribed?

## 2013-05-17 ENCOUNTER — Telehealth: Payer: Self-pay | Admitting: Family Medicine

## 2013-05-17 MED ORDER — BUPROPION HCL ER (SMOKING DET) 150 MG PO TB12: 150.0000 mg | ORAL_TABLET | Freq: Two times a day (BID) | ORAL | Status: DC

## 2013-05-17 NOTE — Telephone Encounter (Signed)
Med sent.

## 2013-05-17 NOTE — Telephone Encounter (Signed)
Pt said that she has thought about it and would like to start taking Wellbutrin. Can you send that into the pharmacy for her?

## 2013-05-28 ENCOUNTER — Ambulatory Visit: Payer: BC Managed Care – PPO | Admitting: Family Medicine

## 2013-05-28 ENCOUNTER — Ambulatory Visit: Payer: Self-pay | Admitting: Family Medicine

## 2013-06-27 ENCOUNTER — Other Ambulatory Visit: Payer: Self-pay | Admitting: Family Medicine

## 2013-07-26 ENCOUNTER — Ambulatory Visit (INDEPENDENT_AMBULATORY_CARE_PROVIDER_SITE_OTHER): Payer: BC Managed Care – PPO | Admitting: Family Medicine

## 2013-07-26 VITALS — BP 130/90 | HR 100 | Temp 98.0°F | Resp 18 | Wt 134.0 lb

## 2013-07-26 DIAGNOSIS — J439 Emphysema, unspecified: Secondary | ICD-10-CM

## 2013-07-26 DIAGNOSIS — F17200 Nicotine dependence, unspecified, uncomplicated: Secondary | ICD-10-CM

## 2013-07-26 DIAGNOSIS — Z720 Tobacco use: Secondary | ICD-10-CM

## 2013-07-26 DIAGNOSIS — B353 Tinea pedis: Secondary | ICD-10-CM

## 2013-07-26 DIAGNOSIS — Z23 Encounter for immunization: Secondary | ICD-10-CM

## 2013-07-26 DIAGNOSIS — L989 Disorder of the skin and subcutaneous tissue, unspecified: Secondary | ICD-10-CM

## 2013-07-26 DIAGNOSIS — J438 Other emphysema: Secondary | ICD-10-CM

## 2013-07-26 MED ORDER — TERBINAFINE HCL 1 % EX CREA: 1.0000 "application " | TOPICAL_CREAM | Freq: Two times a day (BID) | CUTANEOUS | Status: DC

## 2013-07-26 NOTE — Patient Instructions (Signed)
Flu shot given Apply the lamisil twice a day Call if not better in 1 week F/U 3 months

## 2013-07-28 ENCOUNTER — Encounter: Payer: Self-pay | Admitting: Family Medicine

## 2013-07-28 DIAGNOSIS — B353 Tinea pedis: Secondary | ICD-10-CM | POA: Insufficient documentation

## 2013-07-28 DIAGNOSIS — L989 Disorder of the skin and subcutaneous tissue, unspecified: Secondary | ICD-10-CM | POA: Insufficient documentation

## 2013-07-28 NOTE — Assessment & Plan Note (Signed)
Appears to be a callus like lesion, ? Wart but present longer than typical for wart lesions, does not appear to be a bony growth, treat tinea first may need podiatry for removal

## 2013-07-28 NOTE — Assessment & Plan Note (Signed)
Currently stable, continue nebulizer meds Flu shot given

## 2013-07-28 NOTE — Progress Notes (Signed)
  Subjective:    Patient ID: Leslie Drake, female    DOB: 11-07-1950, 62 y.o.   MRN: [id number]  HPI  Pt here with soreness and redness of right foot for the past week. No specific injury. She has had a growth between in 4th and 5th digits for > 1 year but recently it began to grow and cause pain.   COPD/Emphysemia- doing well, wearing oxygen at 2L, able to perform activities around the home without SOB. Using inhalers as prescribed. She restarted zyban in efforts to quit smoking. Down to 4 cig/day  Meds reviewed Flu shot given  Review of Systems - per above GEN- denies fatigue, fever, weight loss,weakness, recent illness HEENT- denies eye drainage, change in vision, nasal discharge, CVS- denies chest pain, palpitations RESP- denies SOB, cough, wheeze ABD- denies N/V, change in stools, abd pain GU- denies dysuria, hematuria, dribbling, incontinence MSK- + joint pain, muscle aches, injury Neuro- denies headache, dizziness, syncope, seizure activity         Objective:   Physical Exam GEN- NAD, alert and oriented x3 HEENT- PERRL, EOMI, non injected sclera, pink conjunctiva, MMM, oropharynx clear Neck- Supple,  CVS- RRR, no murmur, distant HS RESP- CTAB, decreased bases, no wheeze, normal WOB at rest, 2L EXT- No edema Pulses- Radial, DP- 2+ Skin- Right foot between 4th and 5th digits, callused lesion peeling, maceration of skin in web spaces with erythema, no discharge, mild erythema over base of 4th and 5th digits and 2cm down, no warmth, pain with manipulation of 5th toe.        Assessment & Plan:

## 2013-07-28 NOTE — Assessment & Plan Note (Signed)
Continue with zyban

## 2013-07-28 NOTE — Assessment & Plan Note (Signed)
lamisil

## 2013-08-05 ENCOUNTER — Ambulatory Visit: Payer: BC Managed Care – PPO | Admitting: Family Medicine

## 2013-08-06 ENCOUNTER — Ambulatory Visit: Payer: BC Managed Care – PPO | Admitting: Family Medicine

## 2013-08-13 ENCOUNTER — Other Ambulatory Visit: Payer: Self-pay | Admitting: Family Medicine

## 2013-08-13 NOTE — Telephone Encounter (Signed)
Medication refilled per protocol. 

## 2013-10-22 ENCOUNTER — Other Ambulatory Visit: Payer: Self-pay | Admitting: Family Medicine

## 2013-10-23 NOTE — Telephone Encounter (Signed)
Okay to refill? 

## 2013-10-23 NOTE — Telephone Encounter (Signed)
OK refill Zyban?

## 2013-10-29 ENCOUNTER — Ambulatory Visit: Payer: BC Managed Care – PPO | Admitting: Family Medicine

## 2013-12-10 ENCOUNTER — Ambulatory Visit: Payer: BC Managed Care – PPO | Admitting: Family Medicine

## 2014-01-10 ENCOUNTER — Ambulatory Visit (INDEPENDENT_AMBULATORY_CARE_PROVIDER_SITE_OTHER): Payer: BC Managed Care – PPO | Admitting: Family Medicine

## 2014-01-10 ENCOUNTER — Encounter: Payer: Self-pay | Admitting: Family Medicine

## 2014-01-10 VITALS — BP 122/64 | HR 93 | Temp 98.0°F | Resp 18 | Ht 64.0 in | Wt 141.0 lb

## 2014-01-10 DIAGNOSIS — J438 Other emphysema: Secondary | ICD-10-CM

## 2014-01-10 DIAGNOSIS — J439 Emphysema, unspecified: Secondary | ICD-10-CM

## 2014-01-10 DIAGNOSIS — Z720 Tobacco use: Secondary | ICD-10-CM

## 2014-01-10 DIAGNOSIS — F17200 Nicotine dependence, unspecified, uncomplicated: Secondary | ICD-10-CM

## 2014-01-10 MED ORDER — ALBUTEROL SULFATE (2.5 MG/3ML) 0.083% IN NEBU: 2.5000 mg | INHALATION_SOLUTION | RESPIRATORY_TRACT | Status: DC | PRN

## 2014-01-10 MED ORDER — ARFORMOTEROL TARTRATE 15 MCG/2ML IN NEBU: 15.0000 ug | INHALATION_SOLUTION | Freq: Two times a day (BID) | RESPIRATORY_TRACT | Status: DC

## 2014-01-10 MED ORDER — ALBUTEROL SULFATE HFA 108 (90 BASE) MCG/ACT IN AERS: INHALATION_SPRAY | RESPIRATORY_TRACT | Status: DC

## 2014-01-10 NOTE — Progress Notes (Signed)
Patient ID: Leslie Drake, female   DOB: [birth year], 63 y.o.   MRN: [id number]   Subjective:    Patient ID: Leslie Drake, female    DOB: 05-27-1951, 63 y.o.   MRN: [id number]  Patient presents for 6 month F/U  patient here to followup chronic medical problems. 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 She's not been using her provider as prescribed she only uses a few times a week has to use her inhaler around her. She does better with Brovana but for some reason is not using it. She is down to 8 cigarettes a day but has stopped the Wellbutrin and does not want to continue the medication. She declines any preventative medicine    Review Of Systems:  GEN- denies fatigue, fever, weight loss,weakness, recent illness HEENT- denies eye drainage, change in vision, nasal discharge, CVS- denies chest pain, palpitations RESP- denies SOB, cough, +wheeze ABD- denies N/V, change in stools, abd pain Neuro- denies headache, dizziness, syncope, seizure activity       Objective:    BP 122/64  Pulse 93  Temp(Src) 98 F (36.7 C) (Oral)  Resp 18  Ht 5\' 4"  (1.626 m)  Wt 141 lb (63.957 kg)  BMI 24.19 kg/m2  SpO2 97% GEN- NAD, alert and oriented x3, oxygen 2L HEENT- PERRL, EOMI, non injected sclera, pink conjunctiva, MMM, oropharynx clear Neck- Supple, no LAD CVS- RRR, no murmur, distant HS RESP- CTAB, decreased bases, no wheeze, normal WOB  EXT- No edema Pulses- Radial, DP- 2+       Assessment & Plan:      Problem List Items Addressed This Visit   None      Note: This dictation was prepared with Dragon dictation along with smaller phrase technology. Any transcriptional errors that result from this process are unintentional.

## 2014-01-10 NOTE — Assessment & Plan Note (Signed)
Advised to use the Brovana twice a day as prescribed she does not tolerate inhaled steroids. This will decrease the use of her albuterol inhaler

## 2014-01-10 NOTE — Patient Instructions (Signed)
Continue current medications Fax to be sent to [facility] for the medicine Cup  F/U 6 months

## 2014-01-10 NOTE — Assessment & Plan Note (Signed)
Continue to encourage tobacco cessation will keep her from having exacerbations

## 2014-06-04 ENCOUNTER — Telehealth: Payer: Self-pay | Admitting: *Deleted

## 2014-06-04 ENCOUNTER — Encounter (HOSPITAL_COMMUNITY): Payer: Self-pay | Admitting: Emergency Medicine

## 2014-06-04 ENCOUNTER — Inpatient Hospital Stay (HOSPITAL_COMMUNITY)
Admission: EM | Admit: 2014-06-04 | Discharge: 2014-06-07 | DRG: 190 | Disposition: A | Discharge status: Home Health | Payer: BC Managed Care – PPO | Attending: Internal Medicine | Admitting: Internal Medicine

## 2014-06-04 ENCOUNTER — Emergency Department (HOSPITAL_COMMUNITY): Payer: BC Managed Care – PPO

## 2014-06-04 DIAGNOSIS — E872 Acidosis, unspecified: Secondary | ICD-10-CM

## 2014-06-04 DIAGNOSIS — Z85850 Personal history of malignant neoplasm of thyroid: Secondary | ICD-10-CM | POA: Diagnosis not present

## 2014-06-04 DIAGNOSIS — R7309 Other abnormal glucose: Secondary | ICD-10-CM | POA: Diagnosis present

## 2014-06-04 DIAGNOSIS — J9602 Acute respiratory failure with hypercapnia: Secondary | ICD-10-CM | POA: Diagnosis present

## 2014-06-04 DIAGNOSIS — J441 Chronic obstructive pulmonary disease with (acute) exacerbation: Principal | ICD-10-CM | POA: Diagnosis present

## 2014-06-04 DIAGNOSIS — Z800 Family history of malignant neoplasm of digestive organs: Secondary | ICD-10-CM | POA: Diagnosis not present

## 2014-06-04 DIAGNOSIS — D75839 Thrombocytosis, unspecified: Secondary | ICD-10-CM

## 2014-06-04 DIAGNOSIS — R000 Tachycardia, unspecified: Secondary | ICD-10-CM | POA: Diagnosis present

## 2014-06-04 DIAGNOSIS — J9620 Acute and chronic respiratory failure, unspecified whether with hypoxia or hypercapnia: Secondary | ICD-10-CM | POA: Diagnosis present

## 2014-06-04 DIAGNOSIS — Z9981 Dependence on supplemental oxygen: Secondary | ICD-10-CM | POA: Diagnosis not present

## 2014-06-04 DIAGNOSIS — R0602 Shortness of breath: Secondary | ICD-10-CM | POA: Diagnosis present

## 2014-06-04 DIAGNOSIS — Z720 Tobacco use: Secondary | ICD-10-CM | POA: Diagnosis present

## 2014-06-04 DIAGNOSIS — F1020 Alcohol dependence, uncomplicated: Secondary | ICD-10-CM

## 2014-06-04 DIAGNOSIS — E039 Hypothyroidism, unspecified: Secondary | ICD-10-CM | POA: Diagnosis present

## 2014-06-04 DIAGNOSIS — F17200 Nicotine dependence, unspecified, uncomplicated: Secondary | ICD-10-CM

## 2014-06-04 DIAGNOSIS — E8729 Other acidosis: Secondary | ICD-10-CM

## 2014-06-04 DIAGNOSIS — E785 Hyperlipidemia, unspecified: Secondary | ICD-10-CM

## 2014-06-04 DIAGNOSIS — T380X5A Adverse effect of glucocorticoids and synthetic analogues, initial encounter: Secondary | ICD-10-CM | POA: Diagnosis present

## 2014-06-04 DIAGNOSIS — D473 Essential (hemorrhagic) thrombocythemia: Secondary | ICD-10-CM

## 2014-06-04 DIAGNOSIS — J9622 Acute and chronic respiratory failure with hypercapnia: Secondary | ICD-10-CM

## 2014-06-04 DIAGNOSIS — Z833 Family history of diabetes mellitus: Secondary | ICD-10-CM

## 2014-06-04 DIAGNOSIS — IMO0001 Reserved for inherently not codable concepts without codable children: Secondary | ICD-10-CM | POA: Diagnosis present

## 2014-06-04 DIAGNOSIS — R928 Other abnormal and inconclusive findings on diagnostic imaging of breast: Secondary | ICD-10-CM

## 2014-06-04 HISTORY — DX: Tobacco use: Z72.0

## 2014-06-04 HISTORY — DX: Malignant neoplasm of thyroid gland: C73

## 2014-06-04 HISTORY — DX: Hypothyroidism, unspecified: E03.9

## 2014-06-04 HISTORY — DX: Alcohol abuse, uncomplicated: F10.10

## 2014-06-04 LAB — BLOOD GAS, ARTERIAL
ACID-BASE DEFICIT: 2.2 mmol/L — AB (ref 0.0–2.0)
Acid-Base Excess: 3.6 mmol/L — ABNORMAL HIGH (ref 0.0–2.0)
Acid-Base Excess: 2.9 mmol/L — ABNORMAL HIGH (ref 0.0–2.0)
Bicarbonate: 30.6 mEq/L — ABNORMAL HIGH (ref 20.0–24.0)
Bicarbonate: 26.2 mEq/L — ABNORMAL HIGH (ref 20.0–24.0)
Bicarbonate: 28.1 mEq/L — ABNORMAL HIGH (ref 20.0–24.0)
Delivery systems: POSITIVE
Delivery systems: POSITIVE
Drawn by: 38235
Expiratory PAP: 7
Expiratory PAP: 7
FIO2: 40 %
FIO2: 0.3 %
INSPIRATORY PAP: 14
Inspiratory PAP: 14
O2 Content: 8 L/min
O2 SAT: 95.9 %
O2 Saturation: 97.7 %
O2 Saturation: 94.4 %
PATIENT TEMPERATURE: 37
PCO2 ART: 64.1 mmHg — AB (ref 35.0–45.0)
PCO2 ART: 52.6 mmHg — AB (ref 35.0–45.0)
PH ART: 7.227 — AB (ref 7.350–7.450)
PH ART: 7.235 — AB (ref 7.350–7.450)
PH ART: 7.347 — AB (ref 7.350–7.450)
PO2 ART: 96.5 mmHg (ref 80.0–100.0)
Patient temperature: 37
RATE: 10 resp/min
TCO2: 28.2 mmol/L (ref 0–100)
TCO2: 24.3 mmol/L (ref 0–100)
TCO2: 25.1 mmol/L (ref 0–100)
pCO2 arterial: 76.4 mmHg (ref 35.0–45.0)
pO2, Arterial: 100 mmHg (ref 80.0–100.0)
pO2, Arterial: 74.5 mmHg — ABNORMAL LOW (ref 80.0–100.0)

## 2014-06-04 LAB — BASIC METABOLIC PANEL
ANION GAP: 14 (ref 5–15)
BUN: 12 mg/dL (ref 6–23)
CALCIUM: 9.6 mg/dL (ref 8.4–10.5)
CO2: 32 meq/L (ref 19–32)
Chloride: 88 mEq/L — ABNORMAL LOW (ref 96–112)
Creatinine, Ser: 0.51 mg/dL (ref 0.50–1.10)
GFR calc non Af Amer: >90 mL/min (ref >90)
Glucose, Bld: 99 mg/dL (ref 70–99)
Potassium: 4.9 mEq/L (ref 3.7–5.3)
SODIUM: 134 meq/L — AB (ref 137–147)

## 2014-06-04 LAB — CBC WITH DIFFERENTIAL/PLATELET
Basophils Absolute: 0 10*3/uL (ref 0.0–0.1)
Basophils Relative: 0 % (ref 0–1)
Eosinophils Absolute: 0.1 10*3/uL (ref 0.0–0.7)
Eosinophils Relative: 1 % (ref 0–5)
HCT: 44.7 % (ref 36.0–46.0)
Hemoglobin: 14.9 g/dL (ref 12.0–15.0)
LYMPHS PCT: 31 % (ref 12–46)
Lymphs Abs: 2.6 10*3/uL (ref 0.7–4.0)
MCH: 31.2 pg (ref 26.0–34.0)
MCHC: 33.3 g/dL (ref 30.0–36.0)
MCV: 93.7 fL (ref 78.0–100.0)
Monocytes Absolute: 0.6 10*3/uL (ref 0.1–1.0)
Monocytes Relative: 7 % (ref 3–12)
NEUTROS ABS: 5.1 10*3/uL (ref 1.7–7.7)
Neutrophils Relative %: 61 % (ref 43–77)
Platelets: 282 10*3/uL (ref 150–400)
RBC: 4.77 MIL/uL (ref 3.87–5.11)
RDW: 12.4 % (ref 11.5–15.5)
WBC: 8.3 10*3/uL (ref 4.0–10.5)

## 2014-06-04 LAB — GLUCOSE, CAPILLARY
GLUCOSE-CAPILLARY: 228 mg/dL — AB (ref 70–99)
Glucose-Capillary: 162 mg/dL — ABNORMAL HIGH (ref 70–99)

## 2014-06-04 LAB — TROPONIN I: Troponin I: <0.3 ng/mL (ref <0.30)

## 2014-06-04 LAB — PRO B NATRIURETIC PEPTIDE: Pro B Natriuretic peptide (BNP): 86.5 pg/mL (ref 0–125)

## 2014-06-04 LAB — MRSA PCR SCREENING: MRSA BY PCR: NEGATIVE

## 2014-06-04 MED ORDER — LORAZEPAM 2 MG/ML IJ SOLN
INTRAMUSCULAR | Status: AC
  Filled 2014-06-04: qty 1

## 2014-06-04 MED ORDER — ACETAMINOPHEN 325 MG PO TABS
650.0000 mg | ORAL_TABLET | Freq: Four times a day (QID) | ORAL | Status: DC | PRN
Start: 2014-06-04 — End: 2014-06-07
  Administered 2014-06-04 – 2014-06-06 (×2): 650 mg via ORAL
  Filled 2014-06-04 (×2): qty 2

## 2014-06-04 MED ORDER — ACETAMINOPHEN 650 MG RE SUPP
650.0000 mg | Freq: Four times a day (QID) | RECTAL | Status: DC | PRN
Start: 2014-06-04 — End: 2014-06-07

## 2014-06-04 MED ORDER — IPRATROPIUM-ALBUTEROL 0.5-2.5 (3) MG/3ML IN SOLN
3.0000 mL | RESPIRATORY_TRACT | Status: DC
  Administered 2014-06-04 – 2014-06-07 (×17): 3 mL via RESPIRATORY_TRACT
  Filled 2014-06-04 (×17): qty 3

## 2014-06-04 MED ORDER — CETYLPYRIDINIUM CHLORIDE 0.05 % MT LIQD
7.0000 mL | Freq: Two times a day (BID) | OROMUCOSAL | Status: DC
  Administered 2014-06-05 – 2014-06-06 (×4): 7 mL via OROMUCOSAL

## 2014-06-04 MED ORDER — SODIUM CHLORIDE 0.9 % IV SOLN
INTRAVENOUS | Status: DC
  Administered 2014-06-04: 13:00:00 via INTRAVENOUS

## 2014-06-04 MED ORDER — MAGNESIUM SULFATE 50 % IJ SOLN: 2.0000 g | Freq: Once | INTRAVENOUS | Status: DC

## 2014-06-04 MED ORDER — NICOTINE 21 MG/24HR TD PT24
21.0000 mg | MEDICATED_PATCH | Freq: Every day | TRANSDERMAL | Status: DC
  Administered 2014-06-04 – 2014-06-07 (×4): 21 mg via TRANSDERMAL
  Filled 2014-06-04 (×4): qty 1

## 2014-06-04 MED ORDER — THIAMINE HCL 100 MG/ML IJ SOLN
100.0000 mg | Freq: Every day | INTRAMUSCULAR | Status: DC
  Administered 2014-06-04: 100 mg via INTRAVENOUS
  Filled 2014-06-04: qty 2

## 2014-06-04 MED ORDER — ALBUTEROL SULFATE (2.5 MG/3ML) 0.083% IN NEBU
2.5000 mg | INHALATION_SOLUTION | RESPIRATORY_TRACT | Status: DC | PRN
  Administered 2014-06-05: 2.5 mg via RESPIRATORY_TRACT
  Filled 2014-06-04: qty 3

## 2014-06-04 MED ORDER — FOLIC ACID 1 MG PO TABS
1.0000 mg | ORAL_TABLET | Freq: Every day | ORAL | Status: DC
  Administered 2014-06-05 – 2014-06-07 (×3): 1 mg via ORAL
  Filled 2014-06-04 (×3): qty 1

## 2014-06-04 MED ORDER — POTASSIUM CHLORIDE IN NACL 20-0.9 MEQ/L-% IV SOLN
INTRAVENOUS | Status: DC
  Administered 2014-06-04 – 2014-06-06 (×3): via INTRAVENOUS

## 2014-06-04 MED ORDER — ALBUTEROL (5 MG/ML) CONTINUOUS INHALATION SOLN: 10.0000 mg/h | INHALATION_SOLUTION | RESPIRATORY_TRACT | Status: DC

## 2014-06-04 MED ORDER — VITAMIN B-1 100 MG PO TABS
100.0000 mg | ORAL_TABLET | Freq: Every day | ORAL | Status: DC
  Administered 2014-06-05 – 2014-06-07 (×3): 100 mg via ORAL
  Filled 2014-06-04 (×3): qty 1

## 2014-06-04 MED ORDER — INSULIN ASPART 100 UNIT/ML ~~LOC~~ SOLN: 0.0000 [IU] | Freq: Three times a day (TID) | SUBCUTANEOUS | Status: DC

## 2014-06-04 MED ORDER — DEXTROSE 5 % IV SOLN
500.0000 mg | Freq: Once | INTRAVENOUS | Status: DC
  Filled 2014-06-04: qty 500

## 2014-06-04 MED ORDER — LORAZEPAM 2 MG/ML IJ SOLN: 1.0000 mg | Freq: Four times a day (QID) | INTRAMUSCULAR | Status: DC | PRN

## 2014-06-04 MED ORDER — ENOXAPARIN SODIUM 40 MG/0.4ML ~~LOC~~ SOLN
40.0000 mg | SUBCUTANEOUS | Status: DC
  Administered 2014-06-04 – 2014-06-06 (×3): 40 mg via SUBCUTANEOUS
  Filled 2014-06-04 (×3): qty 0.4

## 2014-06-04 MED ORDER — INSULIN ASPART 100 UNIT/ML ~~LOC~~ SOLN
0.0000 [IU] | SUBCUTANEOUS | Status: DC
  Administered 2014-06-04: 3 [IU] via SUBCUTANEOUS
  Administered 2014-06-04: 5 [IU] via SUBCUTANEOUS
  Administered 2014-06-05: 2 [IU] via SUBCUTANEOUS

## 2014-06-04 MED ORDER — ADULT MULTIVITAMIN W/MINERALS CH
1.0000 | ORAL_TABLET | Freq: Every day | ORAL | Status: DC
  Administered 2014-06-05 – 2014-06-07 (×3): 1 via ORAL
  Filled 2014-06-04 (×3): qty 1

## 2014-06-04 MED ORDER — HYDROMORPHONE HCL 1 MG/ML IJ SOLN: 0.2500 mg | INTRAMUSCULAR | Status: DC | PRN

## 2014-06-04 MED ORDER — DEXTROSE 5 % IV SOLN
1.0000 g | INTRAVENOUS | Status: DC
  Administered 2014-06-05 – 2014-06-07 (×3): 1 g via INTRAVENOUS
  Filled 2014-06-04 (×6): qty 10

## 2014-06-04 MED ORDER — ALBUTEROL (5 MG/ML) CONTINUOUS INHALATION SOLN
15.0000 mg/h | INHALATION_SOLUTION | RESPIRATORY_TRACT | Status: DC
  Administered 2014-06-04 (×2): 15 mg/h via RESPIRATORY_TRACT
  Filled 2014-06-04 (×2): qty 20

## 2014-06-04 MED ORDER — DEXTROSE 5 % IV SOLN
1.0000 g | Freq: Once | INTRAVENOUS | Status: AC
  Administered 2014-06-04: 1 g via INTRAVENOUS
  Filled 2014-06-04: qty 10

## 2014-06-04 MED ORDER — ALBUTEROL SULFATE (2.5 MG/3ML) 0.083% IN NEBU: 2.5000 mg | INHALATION_SOLUTION | RESPIRATORY_TRACT | Status: DC

## 2014-06-04 MED ORDER — LORAZEPAM 2 MG/ML IJ SOLN
0.5000 mg | Freq: Once | INTRAMUSCULAR | Status: AC
  Administered 2014-06-04: 0.5 mg via INTRAVENOUS

## 2014-06-04 MED ORDER — DEXTROSE 5 % IV SOLN
500.0000 mg | Freq: Once | INTRAVENOUS | Status: AC
  Administered 2014-06-04: 500 mg via INTRAVENOUS
  Filled 2014-06-04: qty 500

## 2014-06-04 MED ORDER — LORAZEPAM 1 MG PO TABS
1.0000 mg | ORAL_TABLET | Freq: Four times a day (QID) | ORAL | Status: DC | PRN
  Administered 2014-06-06: 1 mg via ORAL
  Filled 2014-06-04: qty 2

## 2014-06-04 MED ORDER — IPRATROPIUM BROMIDE 0.02 % IN SOLN: 0.5000 mg | RESPIRATORY_TRACT | Status: DC

## 2014-06-04 MED ORDER — DEXTROSE 5 % IV SOLN
500.0000 mg | INTRAVENOUS | Status: DC
  Administered 2014-06-05 – 2014-06-06 (×2): 500 mg via INTRAVENOUS
  Filled 2014-06-04 (×6): qty 500

## 2014-06-04 MED ORDER — CHLORHEXIDINE GLUCONATE 0.12 % MT SOLN
15.0000 mL | Freq: Two times a day (BID) | OROMUCOSAL | Status: DC
  Administered 2014-06-04 – 2014-06-06 (×4): 15 mL via OROMUCOSAL
  Filled 2014-06-04 (×4): qty 15

## 2014-06-04 MED ORDER — METHYLPREDNISOLONE SODIUM SUCC 125 MG IJ SOLR
80.0000 mg | Freq: Four times a day (QID) | INTRAMUSCULAR | Status: DC
Start: 2014-06-04 — End: 2014-06-05
  Administered 2014-06-04 – 2014-06-05 (×3): 80 mg via INTRAVENOUS
  Filled 2014-06-04 (×3): qty 2

## 2014-06-04 MED ORDER — INFLUENZA VAC SPLIT QUAD 0.5 ML IM SUSY
0.5000 mL | PREFILLED_SYRINGE | INTRAMUSCULAR | Status: AC
  Administered 2014-06-05: 0.5 mL via INTRAMUSCULAR
  Filled 2014-06-04: qty 0.5

## 2014-06-04 MED ORDER — MAGNESIUM SULFATE 40 MG/ML IJ SOLN
2.0000 g | Freq: Once | INTRAMUSCULAR | Status: AC
  Administered 2014-06-04: 2 g via INTRAVENOUS
  Filled 2014-06-04: qty 50

## 2014-06-04 MED ORDER — INSULIN ASPART 100 UNIT/ML ~~LOC~~ SOLN: 0.0000 [IU] | Freq: Every day | SUBCUTANEOUS | Status: DC

## 2014-06-04 NOTE — H&P (Signed)
[facility] History and Physical  Leslie Drake VQQ:[id number] DOB: 1951-02-10 DOA: 06/04/2014  Referring physician: ED physician, Dr. Leonides Schanz PCP: Vic Blackbird, MD   Chief Complaint: Shortness of breath HPI: Leslie Drake is a 63 y.o. female   The patient is a 63 year old woman with a history of COPD/emphysema, on chronic oxygen, hypothyroidism, and thyroid cancer, who presents to the emergency department with a chief complaint of shortness of breath. The history is provided by both the patient and by mostly her husband. Currently, she is on BiPAP. The patient was in her usual state of health until approximately one week ago when she "caught a cold" from her husband. Last week, she had a stuffy nose, chest congestion, and a nonproductive cough. Over the last 2 days, she has become significantly more short of breath. She denies outright shortness of breath when laying flat or unusual swelling in her legs, but she is short of breath consistently throughout all activities. She has had significantly more wheezing and chest congestion. She has a productive cough, but she does not know the color of her sputum. She denies pleurisy or chest pain. She denies subjective fever or chills. She denies nausea, vomiting, or diarrhea. She tried using more nebulizer treatments at home, but they did not relieve her symptoms. She continued to try to smoke at least a half a pack of cigarettes per day.  In the ED, the patient is afebrile and mildly tachycardic with a heart rate ranging from 95-110 beats per minute. She is oxygenating from 94-100% on supplemental oxygen. She was placed on BiPAP immediately upon arrival. Apparently she was given 125 mg of Solu-Medrol by EMS in route. Her lab data are significant for an ABG that reveals a pH of 7.22, PCO2 of 76.4, and a PO2 of 100. Following BiPAP, the ABG was repeated and revealed a pH of 7.235, PCO2 of 64, and a PO2 of 96.5. Her chest x-ray reveals underlying  emphysematous changes, but no consolidation or edema. She is being admitted for further evaluation and management.   Review of Systems:  As above in history present illness. In addition, she becomes agitated when she has not had her glasses of wine. Otherwise, review of systems is negative.  Past Medical History  Diagnosis Date  . Hypothyroidism   . COPD (chronic obstructive pulmonary disease)   . Thyroid cancer 1980s    Status post partial thyroidectomy  . Alcohol abuse   . Tobacco abuse    Past Surgical History  Procedure Laterality Date  . Abdominal hysterectomy    . Thyroid surgery     Social History: She is married. She has 3 children. She is retired. She smokes a half a pack to pack of cigarettes per day. She drinks at least a liter of wine per day although she reports drinking only a couple of glasses of wine daily. (Her true alcohol intake was reported by her husband outside of her room). She denies illicit drug use or smokeless tobacco use.    Allergies  Allergen Reactions  . Advair Diskus [Fluticasone-Salmeterol]   . Codeine Rash    Family History  Problem Relation Age of Onset  . Diabetes Maternal Aunt    Family history continued. Her mother died of old age. Her father died of colon cancer.   Prior to Admission medications   Medication Sig Start Date End Date Taking? Authorizing Provider  albuterol (PROVENTIL HFA;VENTOLIN HFA) 108 (90 BASE) MCG/ACT inhaler Inhale 2 puffs into the lungs  every 3 (three) hours as needed for wheezing or shortness of breath.   Yes Historical Provider, MD  albuterol (PROVENTIL) (2.5 MG/3ML) 0.083% nebulizer solution Take 2.5 mg by nebulization 2 (two) times daily.   Yes Historical Provider, MD   Physical Exam: Filed Vitals:   06/04/14 1302 06/04/14 1324 06/04/14 1400 06/04/14 1523  BP:   114/85   Pulse:  105 94 108  Temp:      TempSrc:      Resp:  21    Height:      Weight:      SpO2: 100% 100% 94% 98%    Wt Readings from Last  3 Encounters:  06/04/14 63.504 kg (140 lb)  01/10/14 63.957 kg (141 lb)  07/26/13 60.782 kg (134 lb)    General:  63 year old woman in some respiratory distress on BiPAP. Eyes: PERRL, normal lids, irises & conjunctiva ENT: Difficult to examine because of BiPAP, but her mucous membranes appear to be dry and her oropharynx. Neck: no LAD, masses or thyromegaly Cardiovascular: S1, S2, distant, with mild tachycardia. No LE edema. Telemetry: Sinus tachycardia.  Respiratory: Currently on BiPAP; breathing mildly labored; lung sounds with diffuse crackles and wheezes throughout all lung fields. Good air movement down to the bases. Abdomen: Soft, positive bowel sounds, nontender, nondistended. Skin: no rash or induration seen on limited exam Musculoskeletal: grossly normal tone BUE/BLE; no acute hot joints. Psychiatric: Her speech is clear. She is alert and oriented x3. No signs of alcohol withdrawal tremor. Neurologic: Cranial nerves II through XII are intact; grossly nonfocal.           Labs on Admission:  Basic Metabolic Panel:  Recent Labs Lab 06/04/14 1249  NA 134*  K 4.9  CL 88*  CO2 32  GLUCOSE 99  BUN 12  CREATININE 0.51  CALCIUM 9.6   Liver Function Tests: No results found for this basename: AST, ALT, ALKPHOS, BILITOT, PROT, ALBUMIN,  in the last 168 hours No results found for this basename: LIPASE, AMYLASE,  in the last 168 hours No results found for this basename: AMMONIA,  in the last 168 hours CBC:  Recent Labs Lab 06/04/14 1249  WBC 8.3  NEUTROABS 5.1  HGB 14.9  HCT 44.7  MCV 93.7  PLT 282   Cardiac Enzymes:  Recent Labs Lab 06/04/14 1249  TROPONINI <0.30    BNP (last 3 results)  Recent Labs  06/04/14 1249  PROBNP 86.5   CBG: No results found for this basename: GLUCAP,  in the last 168 hours  Radiological Exams on Admission: Dg Chest Port 1 View  06/04/2014   CLINICAL DATA:  Cough and difficulty breathing  EXAM: PORTABLE CHEST - 1 VIEW   COMPARISON:  May 23, 2012  FINDINGS: There is underlying emphysematous change. There is no edema or consolidation. The heart size is normal. Pulmonary vascularity reflects underlying emphysema. No adenopathy.  IMPRESSION: Underlying emphysematous change.  No edema or consolidation.   Electronically Signed   By: Lowella Grip M.D.   On: 06/04/2014 13:36    EKG: Independently reviewed. Sinus tachycardia with a heart rate of 104 beats per minute and a lot of artifact.  Assessment/Plan Principal Problem:   COPD exacerbation Active Problems:   Acute-on-chronic respiratory failure   Acute respiratory acidosis   Hypothyroidism   Tobacco user   Alcoholism /alcohol abuse   1. Acute on chronic respiratory failure with respiratory acidosis secondary to COPD exacerbation. She has never been intubated. The patient was given IV  Solu-Medrol in route to the hospital by EMS. She was immediately started on BiPAP. She was given multiple continuous nebulizer treatments and oxygen in the ED. Rocephin and azithromycin were also given empirically. She does appear to be in ongoing respiratory distress with significant bronchospasms. She is mentating and her PCO2 has improved after starting BiPAP. She does not appear to be tiring out. We will continue BiPAP continuously for several more hours and when necessary. We'll continue treatment with IV Solu-Medrol, Rocephin, azithromycin, and frequent albuterol/Atrovent nebulizers. We'll give her 2 g of magnesium sulfate empirically. Will wean down oxygen to try to keep her oxygen saturations at around 90% as she appears to be a CO2 retainer. Will consult pulmonologist, Dr. Luan Pulling. The patient was instructed to stop smoking. 2. Tobacco abuse. The patient was advised to stop smoking. We'll place a nicotine patch. We'll order tobacco cessation counseling. 3. Alcoholism. The patient's husband pulled me aside outside of her room and disclosed that she drank more than she let  on. She actually drinks a little more than a liter of wine daily. He reports that she becomes agitated when she does not have her glasses of wine, but her husband denied any history of outright DTs. The longest she has been without drinking alcohol was 2 days. We'll start the CIWA Ativan protocol with vitamin therapy. We'll order social work consultation for assistance with sobriety. 4. Hypothyroidism. The patient reports a history of thyroid cancer as well. She had been treated with Synthroid in the past, but she took herself off of it. We will order TSH/free T4    Code Status: Full code DVT Prophylaxis: Lovenox Family Communication: Discuss with her husband Disposition Plan: Anticipate discharge to home in several days, depending on clinical course.  Time spent: ICU/critical care time one hour and 15 minutes.  [facility] Pager [phone]

## 2014-06-04 NOTE — Telephone Encounter (Signed)
Pt husband called concerned about wife stating that she is having difficulty breathing and trouble walking, states had to adjust O2 levels from 2L to 4L and wanted to know what he should do, he wants to take her to ED but pt is noncompliant in going, husband wanted me to call wife let her know what I suggested. I called wife spoke to her and she had stated that she is having difficulty breathing that husband had brought home a "bug" and now she is not sleeping at night,congested, chest tight and feels it may be Pneumonia. I told pt I feel she should call some one and go to ED, pt states she was not dressed or bathed and can not go at this time. I informed pt that if she is having difficulty breathing she needed to go right away, and that if she doesn't call EMS I will call for her. Pt states she has had symptoms now for 5 days and will wait until husband comes home from work and will have him to take her. I asked pt to promise me she would go and she said she would once he got home. I told pt that I will contact her tomorrow to check on her, pt said that was fine.

## 2014-06-04 NOTE — ED Provider Notes (Signed)
TIME SEEN: 12:37 PM  This chart was scribed for [place], DO by Rayfield Citizen, ED Scribe. This patient was seen in room APA05/APA05 and the patient's care was started at 12:37 PM.   CHIEF COMPLAINT: SOB  HPI:   HPI Comments: Leslie Drake is a 63 y.o. female with history of COPD not on home oxygen who presents to the Emergency Department via EMS complaining of gradually worsening SOB, onset one week prior, with associated productive cough. Patient notes a history of COPD but denies prior experience with similar symptoms. She notes sick contacts - her husband "brought a cold home." She reports an associated subjective fever. Has had a nonproductive cough. Patient denies chest pain. Two breathing treatments were administered via EMS en route. She was also given Solu-Medrol 125 mg IV. Patient does not have a history of CAD, PE or DVT. No recent calf swelling or pain.  ROS: See HPI Constitutional: no fever  Eyes: no drainage  ENT: no runny nose   Cardiovascular:  no chest pain  Resp: SOB  GI: no vomiting GU: no dysuria Integumentary: no rash  Allergy: no hives  Musculoskeletal: no leg swelling  Neurological: no slurred speech ROS otherwise negative  PAST MEDICAL HISTORY/PAST SURGICAL HISTORY:  Past Medical History  Diagnosis Date  . Thyroid disease   . COPD (chronic obstructive pulmonary disease)     MEDICATIONS:  Prior to Admission medications   Medication Sig Start Date End Date Taking? Authorizing Provider  albuterol (PROAIR HFA) 108 (90 BASE) MCG/ACT inhaler INHALE 2 PUFFS BY MOUTH EVERY 4 HOURS AS NEEDED FOR WHEEZING 01/10/14   Alycia Rossetti, MD  albuterol (PROVENTIL) (2.5 MG/3ML) 0.083% nebulizer solution Take 3 mLs (2.5 mg total) by nebulization every 4 (four) hours as needed for wheezing. 01/10/14   Alycia Rossetti, MD  arformoterol (BROVANA) 15 MCG/2ML NEBU Take 2 mLs (15 mcg total) by nebulization 2 (two) times daily. 01/10/14   Alycia Rossetti, MD     ALLERGIES:  Allergies  Allergen Reactions  . Advair Diskus [Fluticasone-Salmeterol]   . Codeine Rash    SOCIAL HISTORY:  History  Substance Use Topics  . Smoking status: Current Every Day Smoker -- 1.00 packs/day    Types: Cigarettes  . Smokeless tobacco: Never Used  . Alcohol Use: 2.4 oz/week    4 Glasses of wine per week    FAMILY HISTORY: Family History  Problem Relation Age of Onset  . Diabetes Maternal Aunt     EXAM: There were no vitals taken for this visit. CONSTITUTIONAL: Alert and oriented and responds appropriately to questions. Nontoxic appearing, moderate respiratory distress, speaking in fragmented sentences HEAD: Normocephalic EYES: Conjunctivae clear, PERRL ENT: normal nose; no rhinorrhea; moist mucous membranes; pharynx without lesions noted NECK: Supple, no meningismus, no LAD  CARD: ; S1 and S2 appreciated; no murmurs, no clicks, no rubs, no gallops, regular rhythm, tachycardic RESP: Tachypneic, no rhonchi, no rales, diffuse expiratory wheezes, diminished at bases bilaterally, moderate respiratory distress, increased work of breathing ABD/GI: Normal bowel sounds; non-distended; soft, non-tender, no rebound, no guarding BACK:  The back appears normal and is non-tender to palpation, there is no CVA tenderness EXT: Normal ROM in all joints; non-tender to palpation; no edema; normal capillary refill; no cyanosis    SKIN: Normal color for age and race; warm NEURO: Moves all extremities equally PSYCH: The patient's mood and manner are appropriate. Grooming and personal hygiene are appropriate.  MEDICAL DECISION MAKING: Patient here with COPD exacerbation.  She has moderate respiratory distress or increased work of breathing. She has received 2 DuoNeb and IV Solu-Medrol with EMS. We'll give continuous epidural treatment and start BiPAP. She is very anxious, we'll give a very small dose of IV Ativan.  ED PROGRESS: Breath sounds are improved with continuous  albuterol. She however is still tachypneic but looks much better off BiPAP. Her initial ABG shows respiratory acidosis. Labs are otherwise unremarkable. Chest x-ray clear. We'll discuss with hospitalist.    Discussed with Dr. Caryn Section for admission to ICU. Repeat ABG shows improvement of her CO2 and pH. We'll continue BiPAP.    CRITICAL CARE Performed by: Nyra Jabs   Total critical care time: 45 minutes  Critical care time was exclusive of separately billable procedures and treating other patients.  Critical care was necessary to treat or prevent imminent or life-threatening deterioration.  Critical care was time spent personally by me on the following activities: development of treatment plan with patient and/or surrogate as well as nursing, discussions with consultants, evaluation of patient's response to treatment, examination of patient, obtaining history from patient or surrogate, ordering and performing treatments and interventions, ordering and review of laboratory studies, ordering and review of radiographic studies, pulse oximetry and re-evaluation of patient's condition.     I personally performed the services described in this documentation, which was scribed in my presence. The recorded information has been reviewed and is accurate.   [place], DO 06/04/14 [phone]

## 2014-06-04 NOTE — Progress Notes (Signed)
Found pt attempting to use a vapor cigarette in room while on continuous bi-pap. Pt was educated about her condition, the need to keep the bipap on, and the need to stop smoking. Pt verbalized understanding. Will continue to monitor.

## 2014-06-04 NOTE — ED Notes (Signed)
PT has became increasingly SOB over the past 2 days and has increased home O2 from 2L to 5L per n/c. EMS administered two breathing tx in route. Upon arrival to ED pt very SOB and anxious. ED physician at bedside.

## 2014-06-04 NOTE — ED Notes (Signed)
Report called to [facility] for ICU room 5.

## 2014-06-05 LAB — BLOOD GAS, ARTERIAL
ACID-BASE EXCESS: 5.4 mmol/L — AB (ref 0.0–2.0)
BICARBONATE: 29.6 meq/L — AB (ref 20.0–24.0)
Drawn by: 25788
O2 Content: 2 L/min
O2 SAT: 90.9 %
PO2 ART: 59.4 mmHg — AB (ref 80.0–100.0)
Patient temperature: 37
TCO2: 26.2 mmol/L (ref 0–100)
pCO2 arterial: 45.1 mmHg — ABNORMAL HIGH (ref 35.0–45.0)
pH, Arterial: 7.433 (ref 7.350–7.450)

## 2014-06-05 LAB — PROTIME-INR
INR: 1.21 (ref 0.00–1.49)
Prothrombin Time: 15.3 seconds — ABNORMAL HIGH (ref 11.6–15.2)

## 2014-06-05 LAB — COMPREHENSIVE METABOLIC PANEL
ALT: 32 U/L (ref 0–35)
ANION GAP: 8 (ref 5–15)
AST: 35 U/L (ref 0–37)
Albumin: 3.5 g/dL (ref 3.5–5.2)
Alkaline Phosphatase: 116 U/L (ref 39–117)
BILIRUBIN TOTAL: 0.2 mg/dL — AB (ref 0.3–1.2)
BUN: 11 mg/dL (ref 6–23)
CHLORIDE: 100 meq/L (ref 96–112)
CO2: 33 meq/L — AB (ref 19–32)
Calcium: 9.1 mg/dL (ref 8.4–10.5)
Creatinine, Ser: 0.51 mg/dL (ref 0.50–1.10)
Glucose, Bld: 115 mg/dL — ABNORMAL HIGH (ref 70–99)
POTASSIUM: 4.8 meq/L (ref 3.7–5.3)
Sodium: 141 mEq/L (ref 137–147)
Total Protein: 6.9 g/dL (ref 6.0–8.3)

## 2014-06-05 LAB — GLUCOSE, CAPILLARY
GLUCOSE-CAPILLARY: 111 mg/dL — AB (ref 70–99)
GLUCOSE-CAPILLARY: 130 mg/dL — AB (ref 70–99)
GLUCOSE-CAPILLARY: 98 mg/dL (ref 70–99)
Glucose-Capillary: 112 mg/dL — ABNORMAL HIGH (ref 70–99)
Glucose-Capillary: 131 mg/dL — ABNORMAL HIGH (ref 70–99)
Glucose-Capillary: 149 mg/dL — ABNORMAL HIGH (ref 70–99)

## 2014-06-05 LAB — T4, FREE: Free T4: 0.9 ng/dL (ref 0.80–1.80)

## 2014-06-05 LAB — CBC
HCT: 37.9 % (ref 36.0–46.0)
Hemoglobin: 12.6 g/dL (ref 12.0–15.0)
MCH: 31.1 pg (ref 26.0–34.0)
MCHC: 33.2 g/dL (ref 30.0–36.0)
MCV: 93.6 fL (ref 78.0–100.0)
Platelets: 273 10*3/uL (ref 150–400)
RBC: 4.05 MIL/uL (ref 3.87–5.11)
RDW: 12.9 % (ref 11.5–15.5)
WBC: 4.6 10*3/uL (ref 4.0–10.5)

## 2014-06-05 LAB — TSH: TSH: 2.72 u[IU]/mL (ref 0.350–4.500)

## 2014-06-05 MED ORDER — INSULIN GLARGINE 100 UNIT/ML ~~LOC~~ SOLN
10.0000 [IU] | Freq: Every day | SUBCUTANEOUS | Status: DC
  Administered 2014-06-05: 10 [IU] via SUBCUTANEOUS
  Filled 2014-06-05: qty 0.1

## 2014-06-05 MED ORDER — INSULIN ASPART 100 UNIT/ML ~~LOC~~ SOLN: 0.0000 [IU] | Freq: Every day | SUBCUTANEOUS | Status: DC

## 2014-06-05 MED ORDER — INSULIN ASPART 100 UNIT/ML ~~LOC~~ SOLN
0.0000 [IU] | Freq: Three times a day (TID) | SUBCUTANEOUS | Status: DC
  Administered 2014-06-05 – 2014-06-06 (×2): 2 [IU] via SUBCUTANEOUS

## 2014-06-05 MED ORDER — METHYLPREDNISOLONE SODIUM SUCC 125 MG IJ SOLR
60.0000 mg | Freq: Four times a day (QID) | INTRAMUSCULAR | Status: DC
  Administered 2014-06-05 – 2014-06-06 (×4): 60 mg via INTRAVENOUS
  Filled 2014-06-05 (×4): qty 2

## 2014-06-05 NOTE — Progress Notes (Signed)
[facility] PROGRESS NOTE  Leslie Drake IZT:[id number] DOB: Apr 28, 1951 DOA: 06/04/2014 PCP: Vic Blackbird, MD    Code Status: Full code Family Communication: Discussed with husband last night. Disposition Plan: Discharge to home when clinically improved.   Consultants:  None  Procedures:  BiPAP  Antibiotics:  Rocephin 06/04/14>>  Azithromycin 06/04/14 >>  HPI/Subjective: The patient is sitting up in bed off of BiPAP. She looks and feels significantly better. She is still short of breath at rest, but not like she was when she was in the ED. She has no complaints of chest pain. Her only complaint is of a hunger and thirst.  Objective: Filed Vitals:   06/05/14 0740  BP:   Pulse:   Temp: 97.8 F (36.6 C)  Resp:    temperature 97.8. Pulse 85-110. Respiratory rate 18. Blood pressure 126 of 36. Oxygen saturation 97% on supplemental oxygen.   Intake/Output Summary (Last 24 hours) at 06/05/14 0747 Last data filed at 06/05/14 0740  Gross per 24 hour  Intake 1275.33 ml  Output   1750 ml  Net -474.67 ml   Filed Weights   06/04/14 1254 06/05/14 0500  Weight: 63.504 kg (140 lb) 66 kg (145 lb 8.1 oz)    Exam:   General:  63 year old Caucasian woman sitting up in bed, in no acute distress. She looks significantly improved clinically.  Cardiovascular: S1, S2, with no murmurs rubs or gallops.  Respiratory: breathing slightly labored. Significantly lower bronchospasms and wheezes with decreased breath sounds in the bases.  Abdomen: positive bowel sounds, soft, nontender, nondistended.  Musculoskeletal/extremities: Pedal pulses palpable bilaterally. No pedal edema. No acute hot red joints.  Neuropsychiatric: She is alert and oriented x3. Cranial nerves II through XII are grossly intact. No signs of tremulousness. Speech is clear.   Data Reviewed: Basic Metabolic Panel:  Recent Labs Lab 06/04/14 1249 06/05/14 0435  NA 134* 141  K 4.9 4.8  CL 88* 100   CO2 32 33*  GLUCOSE 99 115*  BUN 12 11  CREATININE 0.51 0.51  CALCIUM 9.6 9.1   Liver Function Tests:  Recent Labs Lab 06/05/14 0435  AST 35  ALT 32  ALKPHOS 116  BILITOT 0.2*  PROT 6.9  ALBUMIN 3.5   No results found for this basename: LIPASE, AMYLASE,  in the last 168 hours No results found for this basename: AMMONIA,  in the last 168 hours CBC:  Recent Labs Lab 06/04/14 1249 06/05/14 0435  WBC 8.3 4.6  NEUTROABS 5.1  --   HGB 14.9 12.6  HCT 44.7 37.9  MCV 93.7 93.6  PLT 282 273   Cardiac Enzymes:  Recent Labs Lab 06/04/14 1249  TROPONINI <0.30   BNP (last 3 results)  Recent Labs  06/04/14 1249  PROBNP 86.5   CBG:  Recent Labs Lab 06/04/14 1812 06/04/14 2015 06/05/14 0039 06/05/14 0347 06/05/14 0724  GLUCAP 228* 162* 131* 111* 130*    Recent Results (from the past 240 hour(s))  MRSA PCR SCREENING     Status: None   Collection Time    06/04/14  3:00 PM      Result Value Ref Range Status   MRSA by PCR NEGATIVE  NEGATIVE Final   Comment:            The GeneXpert MRSA Assay (FDA     approved for NASAL specimens     only), is one component of a     comprehensive MRSA colonization     surveillance program. It  is not     intended to diagnose MRSA     infection nor to guide or     monitor treatment for     MRSA infections.     Studies: Dg Chest Port 1 View  06/04/2014   CLINICAL DATA:  Cough and difficulty breathing  EXAM: PORTABLE CHEST - 1 VIEW  COMPARISON:  May 23, 2012  FINDINGS: There is underlying emphysematous change. There is no edema or consolidation. The heart size is normal. Pulmonary vascularity reflects underlying emphysema. No adenopathy.  IMPRESSION: Underlying emphysematous change.  No edema or consolidation.   Electronically Signed   By: Lowella Grip M.D.   On: 06/04/2014 13:36    Scheduled Meds: . antiseptic oral rinse  7 mL Mouth Rinse q12n4p  . azithromycin  500 mg Intravenous Q24H  . cefTRIAXone (ROCEPHIN)   IV  1 g Intravenous Q24H  . chlorhexidine  15 mL Mouth Rinse BID  . enoxaparin (LOVENOX) injection  40 mg Subcutaneous Q24H  . folic acid  1 mg Oral Daily  . Influenza vac split quadrivalent PF  0.5 mL Intramuscular Tomorrow-1000  . insulin aspart  0-15 Units Subcutaneous TID WC  . insulin aspart  0-5 Units Subcutaneous QHS  . insulin glargine  10 Units Subcutaneous QHS  . ipratropium-albuterol  3 mL Nebulization Q4H  . methylPREDNISolone (SOLU-MEDROL) injection  80 mg Intravenous Q6H  . multivitamin with minerals  1 tablet Oral Daily  . nicotine  21 mg Transdermal Daily  . thiamine  100 mg Oral Daily   Or  . thiamine  100 mg Intravenous Daily   Continuous Infusions: . 0.9 % NaCl with KCl 20 mEq / L 70 mL/hr at 06/04/14 1604    assessment/plan: Principal Problem:   COPD exacerbation Active Problems:   Acute-on-chronic respiratory failure   Acute respiratory acidosis   Hypothyroidism   Tobacco user   Alcoholism /alcohol abuse    1. Acute on chronic respiratory failure with respiratory acidosis secondary to COPD exacerbation.  She is significantly improved compared to yesterday. Her followup blood gas reveals near resolution of respiratory acidosis and hypercapnia. She was treated successfully overnight with BiPAP. Another ABG is pending on 2 L of nasal cannula. We will transfer her to the step down unit from ICU for continued close monitoring over the next 24 hours. Will start when necessary BiPAP instead of continuous BiPAP. We'll continue IV Solu-Medrol, Rocephin, azithromycin, bronchodilators, and supportive treatment. The patient was strongly advised to stop smoking.  Tobacco abuse. The patient was strongly advised to stop smoking. We'll continue nicotine replacement therapy. Tobacco cessation counseling has been ordered.  Alcohol abuse/alcoholism. The patient's husband pulled me aside outside of her room following admission to disclose that she actually drank more alcohol  than she let on. Apparently, she drinks a liter of wine daily. I asked her about this and she down played the amount that she actually drank. She denies any history of alcohol withdrawal or DTs. Nevertheless, I informed her that the recommended amount of wine per day for a female was 1 glass daily, but no more than 2 glasses daily. Will continue to encourage the patient to decrease her alcohol intake. We'll continue the CIWA Ativan protocol and vitamin therapy. Currently, she has no signs of withdrawal symptomatology.  History of hypothyroidism.  The patient also has a history of thyroid cancer, status post partial thyroidectomy in the 1980s. She reports that she stopped taking Synthroid because it made her feel too bad. TSH  and free T4 have been ordered and are pending.  Hyperglycemia, steroid induced. We'll continue sliding scale NovoLog.  Time spent: 30 minutes.    [facility] Pager [phone]. If 7PM-7AM, please contact night-coverage at [email or web address], password [facility] 06/05/2014, 7:47 AM  LOS: 1 day

## 2014-06-05 NOTE — Progress Notes (Signed)
UR chart review completed.  

## 2014-06-05 NOTE — Care Management Note (Addendum)
    Page 1 of 2   06/06/2014     2:57:52 PM CARE MANAGEMENT NOTE 06/06/2014  Patient:  Leslie Drake, Leslie Drake   Account Number:  [id number]  Date Initiated:  06/05/2014  Documentation initiated by:  Theophilus Kinds  Subjective/Objective Assessment:   Pt admitted from home with COPD exacerbation. Pt lives with her husband and will return home at discharge. Pt requires mod assist with ADL's due to respiratory distress. Pt has home O2 and neb machine for home use.     Action/Plan:   Pt stated that she is in the process of getting inhome help from CNA through a long term care policy. Pt may benefit from [facility] RN and PT at discharge. Will continue to follow for d/c needs.   Anticipated DC Date:  06/09/2014   Anticipated DC Plan:  [place]  CM consult      [facility] Choice  DURABLE MEDICAL EQUIPMENT   Choice offered to / List presented to:  C-1 Patient   DME arranged  Vassie Moselle      DME agency  [place].        Status of service:  In process, will continue to follow Medicare Important Message given?   (If response is "NO", the following Medicare IM given date fields will be blank) Date Medicare IM given:   Medicare IM given by:   Date Additional Medicare IM given:   Additional Medicare IM given by:    Discharge Disposition:    Per UR Regulation:    If discussed at Long Length of Stay Meetings, dates discussed:    Comments:  06/06/14 [place], RN BSN CM Pt potential discharge over the weekend. PT recommends rollator for pt and pt chooses [facility] for rollator. Emma with [facility] is aware and will collect pts information from the chart. Rollator will be dropped shipped to pts home. Pt has home O2 with [employer]. If pt needs home health and d/c over the weekend, weekend staff can arrange home health.  06/05/14 [place], RN BSN CM

## 2014-06-05 NOTE — Progress Notes (Signed)
Leslie Drake was notified about ABG results. Orders received and followed. Continue to monitor the pt.

## 2014-06-06 LAB — BASIC METABOLIC PANEL
Anion gap: 11 (ref 5–15)
BUN: 11 mg/dL (ref 6–23)
CALCIUM: 8.7 mg/dL (ref 8.4–10.5)
CO2: 30 mEq/L (ref 19–32)
Chloride: 104 mEq/L (ref 96–112)
Creatinine, Ser: 0.48 mg/dL — ABNORMAL LOW (ref 0.50–1.10)
GFR calc Af Amer: >90 mL/min (ref >90)
Glucose, Bld: 132 mg/dL — ABNORMAL HIGH (ref 70–99)
Potassium: 4.1 mEq/L (ref 3.7–5.3)
SODIUM: 145 meq/L (ref 137–147)

## 2014-06-06 LAB — GLUCOSE, CAPILLARY
Glucose-Capillary: 117 mg/dL — ABNORMAL HIGH (ref 70–99)
Glucose-Capillary: 102 mg/dL — ABNORMAL HIGH (ref 70–99)
Glucose-Capillary: 123 mg/dL — ABNORMAL HIGH (ref 70–99)
Glucose-Capillary: 141 mg/dL — ABNORMAL HIGH (ref 70–99)

## 2014-06-06 MED ORDER — PREDNISONE 20 MG PO TABS
60.0000 mg | ORAL_TABLET | Freq: Two times a day (BID) | ORAL | Status: DC
  Administered 2014-06-06 – 2014-06-07 (×2): 60 mg via ORAL
  Filled 2014-06-06 (×2): qty 3

## 2014-06-06 MED ORDER — GUAIFENESIN-DM 100-10 MG/5ML PO SYRP
5.0000 mL | ORAL_SOLUTION | ORAL | Status: DC | PRN
  Administered 2014-06-06: 5 mL via ORAL
  Filled 2014-06-06: qty 5

## 2014-06-06 MED ORDER — PHENOL 1.4 % MT LIQD
1.0000 | OROMUCOSAL | Status: DC | PRN
Start: 2014-06-06 — End: 2014-06-07
  Administered 2014-06-06: 1 via OROMUCOSAL
  Filled 2014-06-06: qty 177

## 2014-06-06 MED ORDER — BENZONATATE 100 MG PO CAPS
100.0000 mg | ORAL_CAPSULE | Freq: Three times a day (TID) | ORAL | Status: DC
  Administered 2014-06-06 – 2014-06-07 (×2): 100 mg via ORAL
  Filled 2014-06-06 (×2): qty 1

## 2014-06-06 NOTE — Progress Notes (Signed)
Pat transferred to room 316. Report given to Ophelia Shoulder RN. Vital signs stable at transfer.

## 2014-06-06 NOTE — Progress Notes (Signed)
PT stated she tried to wear BiPAP on 06/05/2014 and felt it was no longer needed. She does not wear CPAP or BiPAP at home. She is on home oxygen 2lpm/[place]

## 2014-06-06 NOTE — Plan of Care (Signed)
Problem: COPD GOLD Progrssion Goal: ABLE TO WEAN TO ROOM AIR Outcome: Progressing [place] during the day BiPaP at night    Goal: Able to Perform ADL's at Baseline Outcome: Progressing Dyspnea on exertion

## 2014-06-06 NOTE — Progress Notes (Signed)
Patient refusing to wear Bipap, Patient stated "I am sorry I just cant do this", reassured patient and placed back on 3L [place], all vital signs stable; RT called and informed patient refusing to were Bipap; will continue to monitor and document any changes in patient condition

## 2014-06-06 NOTE — Evaluation (Signed)
Physical Therapy Evaluation Patient Details Name: Leslie Drake MRN: [id number] DOB: Jan 15, 1951 Today's Date: 06/06/2014   History of Present Illness  Pt is a 63 year old female with a history of COPD/emphysema and thyroid cancer.  She is on supplemental O2 chronically-2L/min.  Pt developed a cold about 2 weeks ago and developed severe respiratory distress.  She was admitted to the ICU and placed on BiPap.  She is now out of ICU and obn 2 L O2.  Pt lives with her husband who works outside fo the home  Pt states that she need assist with bathing and dressing but can get food for herself.  Clinical Impression   Pt was sitting up in the bed at the time of PT evaluation.  She had no complaints. She states that her functional ability has declined during the past 2 years.  Her gait endurance is poor because of her respiratory limitations.  I have recommended that she use a rolling walker to facilitate energy conservation and pt was able to walk 60' today with good stability using a walker.   Her O2 sat did decrease from 95% at rest (on 2 L/min) to 85% after gait.  Her O2 was temporarily increased to 3 L/min and her O2 sat returned to 95% within one minute.  O2 was returned to 2 L/min.  Pt was instructed in scapular retraction to aid in lung capacity.  I have recommended that she use a 4 wheeled walker for gait and pt is very much in favor of this.  We will have to have one ordered.  Pt was instructed in energy conservation techniques and no further PT should be needed.    Follow Up Recommendations No PT follow up    Equipment Recommendations  Other (comment) (Rollator)    Recommendations for Other Services   none    Precautions / Restrictions Precautions Precautions: None Restrictions Weight Bearing Restrictions: No      Mobility  Bed Mobility Overal bed mobility: Independent                Transfers Overall transfer level: Modified independent Equipment used: Rolling walker (2  wheeled)                Ambulation/Gait Ambulation/Gait assistance: Modified independent (Device/Increase time) Ambulation Distance (Feet): 60 Feet Assistive device: Rolling walker (2 wheeled) Gait Pattern/deviations: Trunk flexed   Gait velocity interpretation: Below normal speed for age/gender General Gait Details: pt is able to walk without assistive device, but walker was used for energy conservation...pt states " that was the most walking I have done in 2 weeks!".  Stairs            Wheelchair Mobility    Modified Rankin (Stroke Patients Only)       Balance Overall balance assessment: No apparent balance deficits (not formally assessed)                                           Pertinent Vitals/Pain Pain Assessment: No/denies pain    Home Living Family/patient expects to be discharged to:: Private residence Living Arrangements: Spouse/significant other Available Help at Discharge: Available PRN/intermittently;Family Type of Home: House Home Access: Level entry     Home Layout: One level Home Equipment: None      Prior Function Level of Independence: Needs assistance   Gait / Transfers Assistance Needed: ambulates and  transfers independently  ADL's / Homemaking Assistance Needed: assist needed with bathing and dressing, unable to do household tasks        Hand Dominance   Dominant Hand: Right    Extremity/Trunk Assessment               Lower Extremity Assessment: Overall WFL for tasks assessed      Cervical / Trunk Assessment: Kyphotic  Communication   Communication: No difficulties  Cognition Arousal/Alertness: Awake/alert Behavior During Therapy: WFL for tasks assessed/performed Overall Cognitive Status: Within Functional Limits for tasks assessed                      General Comments      Exercises Other Exercises Other Exercises: scapular retraction for 5 repetitions      Assessment/Plan     PT Assessment Patent does not need any further PT services  PT Diagnosis     PT Problem List    PT Treatment Interventions     PT Goals (Current goals can be found in the Care Plan section) Acute Rehab PT Goals PT Goal Formulation: No goals set, d/c therapy    Frequency     Barriers to discharge  none      Co-evaluation               End of Session Equipment Utilized During Treatment: Gait belt Activity Tolerance: Patient tolerated treatment well Patient left: in chair;with call bell/phone within reach           Time: 1331-1404 PT Time Calculation (min): 33 min   Charges:   PT Evaluation $Initial PT Evaluation Tier I: 1 Procedure     PT G CodesDemetrios Isaacs L 06/06/2014, 2:11 PM

## 2014-06-06 NOTE — Progress Notes (Signed)
RN called to inform me that patient wanted to be taken off BIPAP because she "could not wear it".  Patient placed back on 3 lpm nasal cannula, will continue to monitor.

## 2014-06-06 NOTE — Progress Notes (Signed)
[facility] PROGRESS NOTE  Leslie Drake JHE:[id number] DOB: 1951-09-19 DOA: 06/04/2014 PCP: Vic Blackbird, MD    Code Status: Full code Family Communication: Discussed with husband last night. Disposition Plan: Discharge to home when clinically improved.   Consultants:  None  Procedures:  BiPAP  Antibiotics:  Rocephin 06/04/14>>  Azithromycin 06/04/14 >>  HPI/Subjective: The patient is sitting up in bed, eating breakfast. She feels much better. She is breathing much better. She refused BiPAP overnight.  Objective: Filed Vitals:   06/06/14 0800  BP: 161/92  Pulse: 102  Temp: 98.3 F (36.8 C)  Resp: 21   oxygen saturation on 2 L 99%.   Intake/Output Summary (Last 24 hours) at 06/06/14 0913 Last data filed at 06/06/14 0800  Gross per 24 hour  Intake   2390 ml  Output   3575 ml  Net  -1185 ml   Filed Weights   06/04/14 1254 06/05/14 0500 06/06/14 0500  Weight: 63.504 kg (140 lb) 66 kg (145 lb 8.1 oz) 64.6 kg (142 lb 6.7 oz)    Exam:   General:  63 year old Caucasian woman sitting up in bed, in no acute distress. She looks significantly improved clinically.  Cardiovascular: S1, S2, with no murmurs rubs or gallops.  Respiratory: breathing slightly labored when speaking. Rare scattered wheezes, significantly decreased.  Abdomen: positive bowel sounds, soft, nontender, nondistended.  Musculoskeletal/extremities: Pedal pulses palpable bilaterally. No pedal edema. No acute hot red joints.  Neuropsychiatric: She is alert and oriented x3. Cranial nerves II through XII are grossly intact. No signs of tremulousness. Speech is clear.   Data Reviewed: Basic Metabolic Panel:  Recent Labs Lab 06/04/14 1249 06/05/14 0435 06/06/14 0427  NA 134* 141 145  K 4.9 4.8 4.1  CL 88* 100 104  CO2 32 33* 30  GLUCOSE 99 115* 132*  BUN 12 11 11   CREATININE 0.51 0.51 0.48*  CALCIUM 9.6 9.1 8.7   Liver Function Tests:  Recent Labs Lab 06/05/14 0435  AST  35  ALT 32  ALKPHOS 116  BILITOT 0.2*  PROT 6.9  ALBUMIN 3.5   No results found for this basename: LIPASE, AMYLASE,  in the last 168 hours No results found for this basename: AMMONIA,  in the last 168 hours CBC:  Recent Labs Lab 06/04/14 1249 06/05/14 0435  WBC 8.3 4.6  NEUTROABS 5.1  --   HGB 14.9 12.6  HCT 44.7 37.9  MCV 93.7 93.6  PLT 282 273   Cardiac Enzymes:  Recent Labs Lab 06/04/14 1249  TROPONINI <0.30   BNP (last 3 results)  Recent Labs  06/04/14 1249  PROBNP 86.5   CBG:  Recent Labs Lab 06/05/14 0724 06/05/14 1147 06/05/14 1619 06/05/14 2053 06/06/14 0724  GLUCAP 130* 98 112* 149* 117*    Recent Results (from the past 240 hour(s))  MRSA PCR SCREENING     Status: None   Collection Time    06/04/14  3:00 PM      Result Value Ref Range Status   MRSA by PCR NEGATIVE  NEGATIVE Final   Comment:            The GeneXpert MRSA Assay (FDA     approved for NASAL specimens     only), is one component of a     comprehensive MRSA colonization     surveillance program. It is not     intended to diagnose MRSA     infection nor to guide or     monitor treatment  for     MRSA infections.     Studies: Dg Chest Port 1 View  06/04/2014   CLINICAL DATA:  Cough and difficulty breathing  EXAM: PORTABLE CHEST - 1 VIEW  COMPARISON:  May 23, 2012  FINDINGS: There is underlying emphysematous change. There is no edema or consolidation. The heart size is normal. Pulmonary vascularity reflects underlying emphysema. No adenopathy.  IMPRESSION: Underlying emphysematous change.  No edema or consolidation.   Electronically Signed   By: Lowella Grip M.D.   On: 06/04/2014 13:36    Scheduled Meds: . antiseptic oral rinse  7 mL Mouth Rinse q12n4p  . azithromycin  500 mg Intravenous Q24H  . cefTRIAXone (ROCEPHIN)  IV  1 g Intravenous Q24H  . chlorhexidine  15 mL Mouth Rinse BID  . enoxaparin (LOVENOX) injection  40 mg Subcutaneous Q24H  . folic acid  1 mg Oral  Daily  . insulin aspart  0-15 Units Subcutaneous TID WC  . insulin aspart  0-5 Units Subcutaneous QHS  . insulin glargine  10 Units Subcutaneous QHS  . ipratropium-albuterol  3 mL Nebulization Q4H  . methylPREDNISolone (SOLU-MEDROL) injection  60 mg Intravenous Q6H  . multivitamin with minerals  1 tablet Oral Daily  . nicotine  21 mg Transdermal Daily  . thiamine  100 mg Oral Daily   Or  . thiamine  100 mg Intravenous Daily   Continuous Infusions: . 0.9 % NaCl with KCl 20 mEq / L 70 mL/hr at 06/06/14 0800    assessment/plan: Principal Problem:   COPD exacerbation Active Problems:   Acute-on-chronic respiratory failure   Acute respiratory acidosis   Hypothyroidism   Tobacco user   Alcoholism /alcohol abuse    1. Acute on chronic respiratory failure with respiratory acidosis secondary to COPD exacerbation.  She has improved clinically and symptomatically. Her followup blood gas reveals near resolution of respiratory acidosis and hypercapnia. Her acidosis has resolved. She was treated successfully with BiPAP. Another ABG is pending on 2 L of nasal cannula.  Will continue when necessary BiPAP instead of continuous BiPAP. We'll discontinue IV Solu-Medrol and transitioned her to prednisone. We'll continue Rocephin, azithromycin, bronchodilators, and supportive treatment. The patient was strongly advised to stop smoking.  Tobacco abuse. The patient was strongly advised to stop smoking. We'll continue nicotine replacement therapy. Tobacco cessation counseling was ordered.  Alcohol abuse/alcoholism. The patient's husband pulled me aside outside of her room following admission to disclose that she actually drank more alcohol than she let on. Apparently, she drinks a liter of wine daily. I asked her about this and she down played the amount that she actually drank. She denies any history of alcohol withdrawal or DTs. Nevertheless, I informed her that the recommended amount of wine per day for  a female was 1 glass daily, but no more than 2 glasses daily. Will continue to encourage the patient to decrease her alcohol intake. We'll continue the CIWA Ativan protocol and vitamin therapy. Currently, she has no signs of withdrawal symptomatology.  History of hypothyroidism.  The patient also has a history of thyroid cancer, status post partial thyroidectomy in the 1980s. She reports that she stopped taking Synthroid because it made her feel too bad. TSH and free T4 are within normal limits at 2.7 and 0.90 respectively.  Hyperglycemia, steroid induced. CBGs reasonable. We'll continue sliding scale NovoLog with plans to decrease the dosing off of IV steroids.  Time spent: 30 minutes.    [facility] Pager [phone]. If  7PM-7AM, please contact night-coverage at [email or web address], password [email or web address] 06/06/2014, 9:13 AM  LOS: 2 days

## 2014-06-06 NOTE — Progress Notes (Signed)
Leslie Drake is a 64 y.o. female patient who transferred  from ICU received report from Jadene Pierini, Therapist, sports. Patient was awake, alert  & orientated  X 3, Full Code, VSS - Blood pressure 161/92, pulse 102, temperature 98.3 F (36.8 C), temperature source Oral, resp. rate 21, height 5\' 5"  (1.651 m), weight 64.6 kg (142 lb 6.7 oz), SpO2 95.00%., O2   @ 2 L nasal cannular, no c/o shortness of breath, no c/o chest pain, no distress noted. Tele # 17 placed and pt is currently running: NSR.   IV site WDL: Left AC and Left FA with a transparent dsg that's clean dry and intact.  Allergies:   Allergies  Allergen Reactions  . Advair Diskus [Fluticasone-Salmeterol]   . Codeine Rash     Past Medical History  Diagnosis Date  . Hypothyroidism   . COPD (chronic obstructive pulmonary disease)   . Thyroid cancer 1980s    Status post partial thyroidectomy  . Alcohol abuse   . Tobacco abuse     Pt orientation to unit, room and routine. SR up x 2, fall risk assessment complete with Patient verbalizing understanding of risks associated with falls. Pt verbalizes an understanding of how to use the call bell and to call for help before getting out of bed.  Skin, clean-dry- intact without evidence of bruising, or skin tears.   No evidence of skin break down noted on exam.    Will cont to monitor and assist as needed.  Regino Bellow, RN 06/06/2014 1:42 PM

## 2014-06-07 LAB — GLUCOSE, CAPILLARY
GLUCOSE-CAPILLARY: 88 mg/dL (ref 70–99)
Glucose-Capillary: 98 mg/dL (ref 70–99)

## 2014-06-07 MED ORDER — BENZONATATE 100 MG PO CAPS: 100.0000 mg | ORAL_CAPSULE | Freq: Three times a day (TID) | ORAL | Status: DC | PRN

## 2014-06-07 MED ORDER — ALBUTEROL SULFATE (2.5 MG/3ML) 0.083% IN NEBU: 2.5000 mg | INHALATION_SOLUTION | Freq: Three times a day (TID) | RESPIRATORY_TRACT | Status: DC

## 2014-06-07 MED ORDER — IPRATROPIUM BROMIDE 0.02 % IN SOLN: 0.5000 mg | Freq: Three times a day (TID) | RESPIRATORY_TRACT | Status: DC

## 2014-06-07 MED ORDER — CEFUROXIME AXETIL 500 MG PO TABS: 500.0000 mg | ORAL_TABLET | Freq: Two times a day (BID) | ORAL | Status: DC

## 2014-06-07 MED ORDER — AZITHROMYCIN 250 MG PO TABS
500.0000 mg | ORAL_TABLET | Freq: Every day | ORAL | Status: DC
  Administered 2014-06-07: 500 mg via ORAL
  Filled 2014-06-07: qty 2

## 2014-06-07 MED ORDER — PHENOL 1.4 % MT LIQD: 1.0000 | OROMUCOSAL | Status: DC | PRN

## 2014-06-07 MED ORDER — ADULT MULTIVITAMIN W/MINERALS CH: 1.0000 | ORAL_TABLET | Freq: Every day | ORAL | Status: DC

## 2014-06-07 MED ORDER — PREDNISONE 10 MG PO TABS: ORAL_TABLET | ORAL | Status: DC

## 2014-06-07 NOTE — Discharge Summary (Signed)
Physician Discharge Summary  Leslie Drake:[id number] DOB: 1951-06-16 DOA: 06/04/2014  PCP: Vic Blackbird, MD  Admit date: 06/04/2014 Discharge date: 06/07/2014  Time spent: Greater than 30 minutes  Recommendations for Outpatient Follow-up:  1.   Discharge Diagnoses:  1. COPD with exacerbation/Oxygen-dependent COPD. 2. Acute respiratory failure with hypercapnia. Resolved with BiPAP therapy. 3. Chronic respiratory failure with hypoxia. 4. Acute respiratory acidosis. 5. Tobacco abuse. The patient was advised to stop smoking. 6. Alcohol abuse with no signs of alcohol withdrawal syndrome. 7. Reported history of hypothyroidism, but the patient's TSH and free T4 were within normal limits. 8. Steroid-induced hyperglycemia.  Discharge Condition: Improved.  Diet recommendation: Regular.  Filed Weights   06/05/14 0500 06/06/14 0500 06/07/14 0656  Weight: 66 kg (145 lb 8.1 oz) 64.6 kg (142 lb 6.7 oz) 63.4 kg (139 lb 12.4 oz)    History of present illness:  The patient is a 63 year old woman with oxygen-dependent COPD/emphysema, who presented to the emergency department on 06/04/14 with a chief complaint of shortness of breath. In the ED, she was afebrile and mildly tachycardic with a heart rate ranging from 95-110 beats per minute. She was oxygenating from 94-100% on supplemental oxygen. She was placed on BiPAP immediately upon arrival. Apparently she was given 125 mg of Solu-Medrol by EMS in route. Her lab data were significant for an ABG that revealed a pH of 7.22, PCO2 of 76.4, and a PO2 of 100. Following BiPAP, the ABG was repeated and revealed a pH of 7.235, PCO2 of 64, and a PO2 of 96.5. Her chest x-ray revealed underlying emphysematous changes, but no consolidation or edema. She was admitted for further evaluation and management.   Hospital Course:   1. Acute on chronic respiratory failure with respiratory acidosis secondary to COPD exacerbation.  As above in history of  present illness, the patient was started on BiPAP in the ED. Prior to arrival, EMS gave her 125 mg of IV Solu-Medrol. She was subsequently admitted to the ICU. Steroid therapy was continued. BiPAP was continued. Rocephin and azithromycin IV were ordered empirically. Albuterol and Atrovent nebulizers were given every 4 hours and when necessary. She improved clinically and symptomatically. Her followup blood gas revealed  resolution of respiratory acidosis and hypercapnia on 2 L of nasal cannula oxygen following treatment with BiPAP. At the time of hospital discharge, she was oxygenating in the mid to upper 90s on 2 L of nasal cannula oxygen. She was discharged on a prednisone taper and Ceftin. Atrovent nebulizer was prescribed at the time of discharge to be added to her home regimen of albuterol nebulizer 2-4 times daily. She was continued on chronic nasal cannula oxygen at 2 L per minute.  Tobacco abuse.  The patient was strongly advised to stop smoking. Tobacco cessation counseling was ordered. She was given a nicotine patch during the hospital course. Alcohol abuse/alcoholism.  The patient's husband pulled me aside outside of her room following admission to disclose that she actually drank more alcohol than she let on. Apparently, she drinks a liter of wine daily. I asked her about this and she down played the amount that she actually drank. She denied any history of alcohol withdrawal or DTs. Nevertheless, I informed her that the recommended amount of wine per day for a female was 1 glass daily, but no more than 2 glasses daily. She was started on the CIWA Ativan protocol and vitamin therapy. There were no signs of alcohol withdrawal symptoms during the hospital course.  History  of hypothyroidism.  The patient also has a history of thyroid cancer, status post partial thyroidectomy in the 1980s. She reported that she stopped taking Synthroid because it made her feel too bad. TSH and free T4 were within  normal limits at 2.7 and 0.90 respectively.  Hyperglycemia, steroid induced.  She was treated with sliding scale NovoLog for mild hyperglycemia with steroid therapy.    Procedures:  BiPAP  Consultations:  None  Discharge Exam: Filed Vitals:   06/07/14 0645  BP: 118/74  Pulse: 104  Temp: 98.3 F (36.8 C)  Resp: 20   and oxygen saturation 93-95% on 2 L of oxygen  General: 63 year old Caucasian woman sitting up in bed, in no acute distress. She looks significantly improved clinically.  Cardiovascular: S1, S2, with no murmurs rubs or gallops.  Respiratory: Near resolution of wheezes and crackles bilaterally; breathing nonlabored.  Abdomen: positive bowel sounds, soft, nontender, nondistended.  Musculoskeletal/extremities: Pedal pulses palpable bilaterally. No pedal edema. No acute hot red joints.  Neuropsychiatric: She is alert and oriented x3. Cranial nerves II through XII are grossly intact. No signs of tremulousness. Speech is clear.     Discharge Instructions You were cared for by a hospitalist during your hospital stay. If you have any questions about your discharge medications or the care you received while you were in the hospital after you are discharged, you can call the unit and asked to speak with the hospitalist on call if the hospitalist that took care of you is not available. Once you are discharged, your primary care physician will handle any further medical issues. Please note that NO REFILLS for any discharge medications will be authorized once you are discharged, as it is imperative that you return to your primary care physician (or establish a relationship with a primary care physician if you do not have one) for your aftercare needs so that they can reassess your need for medications and monitor your lab values.  Discharge Instructions   Diet general    Complete by:  As directed      Discharge instructions    Complete by:  As directed   Take medications as  prescribed. Try to stop smoking. Drink no more than 1-2 glasses of wine daily.     Increase activity slowly    Complete by:  As directed           Current Discharge Medication List    START taking these medications   Details  benzonatate (TESSALON) 100 MG capsule Take 1 capsule (100 mg total) by mouth 3 (three) times daily as needed for cough. Qty: 20 capsule, Refills: 0    cefUROXime (CEFTIN) 500 MG tablet Take 1 tablet (500 mg total) by mouth 2 (two) times daily with a meal. Antibiotic to be taken for 4 more days, starting tomorrow. Qty: 8 tablet, Refills: 0    ipratropium (ATROVENT) 0.02 % nebulizer solution Take 2.5 mLs (0.5 mg total) by nebulization 3 (three) times daily. Nebulizer/bronchodilator for additional treatment of COPD. Qty: 75 mL, Refills: 12    Multiple Vitamin (MULTIVITAMIN WITH MINERALS) TABS tablet Take 1 tablet by mouth daily.    phenol (CHLORASEPTIC) 1.4 % LIQD Use as directed 1 spray in the mouth or throat as needed for throat irritation / pain.    predniSONE (DELTASONE) 10 MG tablet Starting tomorrow, take 6 tablets daily for one day; then 5 tablets the next day; then 4 tablets the next day; then 3 tablets the next day; then 2 tablets  the next day; then 1 tablet the next day; then stop. Qty: 21 tablet, Refills: 0      CONTINUE these medications which have CHANGED   Details  albuterol (PROVENTIL) (2.5 MG/3ML) 0.083% nebulizer solution Take 3 mLs (2.5 mg total) by nebulization 3 (three) times daily.      CONTINUE these medications which have NOT CHANGED   Details  albuterol (PROVENTIL HFA;VENTOLIN HFA) 108 (90 BASE) MCG/ACT inhaler Inhale 2 puffs into the lungs every 3 (three) hours as needed for wheezing or shortness of breath.       Allergies  Allergen Reactions  . Advair Diskus [Fluticasone-Salmeterol]   . Codeine Rash   Follow-up Information   Schedule an appointment as soon as possible for a visit with Vic Blackbird, MD.   Specialty:  Family  Medicine   Contact information:   [address] [place] [address] [phone]        The results of significant diagnostics from this hospitalization (including imaging, microbiology, ancillary and laboratory) are listed below for reference.    Significant Diagnostic Studies: Dg Chest Port 1 View  06/04/2014   CLINICAL DATA:  Cough and difficulty breathing  EXAM: PORTABLE CHEST - 1 VIEW  COMPARISON:  May 23, 2012  FINDINGS: There is underlying emphysematous change. There is no edema or consolidation. The heart size is normal. Pulmonary vascularity reflects underlying emphysema. No adenopathy.  IMPRESSION: Underlying emphysematous change.  No edema or consolidation.   Electronically Signed   By: Lowella Grip M.D.   On: 06/04/2014 13:36    Microbiology: Recent Results (from the past 240 hour(s))  MRSA PCR SCREENING     Status: None   Collection Time    06/04/14  3:00 PM      Result Value Ref Range Status   MRSA by PCR NEGATIVE  NEGATIVE Final   Comment:            The GeneXpert MRSA Assay (FDA     approved for NASAL specimens     only), is one component of a     comprehensive MRSA colonization     surveillance program. It is not     intended to diagnose MRSA     infection nor to guide or     monitor treatment for     MRSA infections.     Labs: Basic Metabolic Panel:  Recent Labs Lab 06/04/14 1249 06/05/14 0435 06/06/14 0427  NA 134* 141 145  K 4.9 4.8 4.1  CL 88* 100 104  CO2 32 33* 30  GLUCOSE 99 115* 132*  BUN 12 11 11   CREATININE 0.51 0.51 0.48*  CALCIUM 9.6 9.1 8.7   Liver Function Tests:  Recent Labs Lab 06/05/14 0435  AST 35  ALT 32  ALKPHOS 116  BILITOT 0.2*  PROT 6.9  ALBUMIN 3.5   No results found for this basename: LIPASE, AMYLASE,  in the last 168 hours No results found for this basename: AMMONIA,  in the last 168 hours CBC:  Recent Labs Lab 06/04/14 1249 06/05/14 0435  WBC 8.3 4.6  NEUTROABS 5.1  --   HGB 14.9  12.6  HCT 44.7 37.9  MCV 93.7 93.6  PLT 282 273   Cardiac Enzymes:  Recent Labs Lab 06/04/14 1249  TROPONINI <0.30   BNP: BNP (last 3 results)  Recent Labs  06/04/14 1249  PROBNP 86.5   CBG:  Recent Labs Lab 06/06/14 1120 06/06/14 1740 06/06/14 2118 06/07/14 0827 06/07/14 1139  GLUCAP 102* 123* 141* 88 98       Signed:  Tell Rozelle  [facility] 06/07/2014, 11:43 AM

## 2014-06-07 NOTE — Progress Notes (Signed)
PHARMACIST - PHYSICIAN COMMUNICATION DR:   Caryn Section CONCERNING: Antibiotic IV to Oral Route Change Policy  RECOMMENDATION: This patient is receiving Zithromax by the intravenous route.  Based on criteria approved by the Pharmacy and Therapeutics Committee, the antibiotic(s) is/are being converted to the equivalent oral dose form(s).   DESCRIPTION: These criteria include:  Patient being treated for a respiratory tract infection, urinary tract infection, cellulitis or clostridium difficile associated diarrhea if on metronidazole  The patient is not neutropenic and does not exhibit a GI malabsorption state  The patient is eating (either orally or via tube) and/or has been taking other orally administered medications for a least 24 hours  The patient is improving clinically and has a Tmax < 100.5  If you have questions about this conversion, please contact the Pharmacy Department  [x]   [phone] )  [facility] []   [phone] )  [facility]  []   [phone] )  [facility] []   [phone] )  [place], PharmD, BCPS 06/07/2014@9 :20 AM

## 2014-06-07 NOTE — Progress Notes (Signed)
Discharge instructions and prescriptions given, verbalized understanding,out in stable condition with oxygen via wheelchair.

## 2014-07-11 ENCOUNTER — Ambulatory Visit: Payer: BC Managed Care – PPO | Admitting: Family Medicine

## 2014-07-15 ENCOUNTER — Ambulatory Visit (INDEPENDENT_AMBULATORY_CARE_PROVIDER_SITE_OTHER): Payer: BC Managed Care – PPO | Admitting: Family Medicine

## 2014-07-15 ENCOUNTER — Encounter: Payer: Self-pay | Admitting: Family Medicine

## 2014-07-15 VITALS — BP 130/74 | HR 90 | Temp 98.0°F | Resp 20 | Ht 64.0 in | Wt 142.0 lb

## 2014-07-15 DIAGNOSIS — IMO0001 Reserved for inherently not codable concepts without codable children: Secondary | ICD-10-CM

## 2014-07-15 DIAGNOSIS — Z720 Tobacco use: Secondary | ICD-10-CM

## 2014-07-15 DIAGNOSIS — F1020 Alcohol dependence, uncomplicated: Secondary | ICD-10-CM

## 2014-07-15 DIAGNOSIS — J432 Centrilobular emphysema: Secondary | ICD-10-CM

## 2014-07-15 NOTE — Assessment & Plan Note (Signed)
Patient also is cutting back on her alcohol use. She did downplay much alcohol she was drinking. We asked about depression symptoms or other things leading to her alcohol but she denies a particular factors

## 2014-07-15 NOTE — Progress Notes (Signed)
Patient ID: Leslie Drake, female   DOB: [birth year], 63 y.o.   MRN: [id number]   Subjective:    Patient ID: Leslie Drake, female    DOB: 10-25-1950, 62 y.o.   MRN: [id number]  Patient presents for 6 month F/U and DME  patient here to follow-up possible admission for COPD and respiratory failure. She was placed on BiPAP and intravenous steroids and then she was able to be tapered back down to her 2 L of oxygen. She is now at her baseline which is fair at the very least. He also was noted that she has had an alcohol problem that her husband discussed with the hospital staff. She was drinking up to half of gallon alcohol a day she is now down to 3-4 cups a day. She's also down to 4 cigarettes a day    Review Of Systems:  GEN- denies fatigue, fever, weight loss,weakness, recent illness HEENT- denies eye drainage, change in vision, nasal discharge, CVS- denies chest pain, palpitations RESP- + SOB, +cough, wheeze ABD- denies N/V, change in stools, abd pain GU- denies dysuria, hematuria, dribbling, incontinence MSK- denies joint pain, muscle aches, injury Neuro- denies headache, dizziness, syncope, seizure activity       Objective:    BP 130/74  Pulse 90  Temp(Src) 98 F (36.7 C) (Oral)  Resp 20  Ht 5\' 4"  (1.626 m)  Wt 142 lb (64.411 kg)  BMI 24.36 kg/m2  SpO2 99% GEN- NAD, alert and oriented x3, chronically ill appearing HEENT- PERRL, EOMI, non injected sclera, pink conjunctiva, MMM, oropharynx clear Neck- Supple, no LAD CVS- RRR, no murmur RESP-, few wheezes, decreased at bases, 2 L oxygen EXT- No edema Pulses- Radial, DP- 2+        Assessment & Plan:      Problem List Items Addressed This Visit   Tobacco user   Emphysema/COPD - Primary      Note: This dictation was prepared with Dragon dictation along with smaller phrase technology. Any transcriptional errors that result from this process are unintentional.

## 2014-07-15 NOTE — Patient Instructions (Addendum)
Okay to order concentrator Continue current nebulizers Good job with the smoking!  F/U with Home Health

## 2014-07-15 NOTE — Assessment & Plan Note (Signed)
There is COPD is back to her baseline which is fair at best. She will continue with her nebulized medications. She has not tolerated steroids in the inhaler form. She's been given her flu shot. We will try to get her oxygen concentrator she is having difficulty caring around her portable oxygen and current state. His also working to get a nurses assist her at home for bathing and cooking and cleaning

## 2014-07-30 ENCOUNTER — Telehealth: Payer: Self-pay | Admitting: Family Medicine

## 2014-07-30 NOTE — Telephone Encounter (Signed)
Pt is already on home oxygen, she was trying to get a concentrator. If they need walking oxygen sat she needs to come have that done

## 2014-07-30 NOTE — Telephone Encounter (Signed)
Pt needs home oxygen.  We do not have the required Oxygen readings on file to qualify her.  Need O2 reading on and off O2.  Need O2 level exercising (walking) on and off O2.  Told patient and DME provider that patient just seen by provider.  She can come to office for nurse visit and nurse can do those readings for her.

## 2014-12-15 ENCOUNTER — Ambulatory Visit: Payer: BC Managed Care – PPO | Admitting: Family Medicine

## 2014-12-19 ENCOUNTER — Encounter: Payer: Self-pay | Admitting: Family Medicine

## 2014-12-19 ENCOUNTER — Ambulatory Visit (INDEPENDENT_AMBULATORY_CARE_PROVIDER_SITE_OTHER): Payer: BLUE CROSS/BLUE SHIELD | Admitting: Family Medicine

## 2014-12-19 VITALS — BP 122/76 | HR 100 | Temp 98.1°F | Resp 22 | Ht 64.0 in | Wt 140.0 lb

## 2014-12-19 DIAGNOSIS — J432 Centrilobular emphysema: Secondary | ICD-10-CM

## 2014-12-19 DIAGNOSIS — E785 Hyperlipidemia, unspecified: Secondary | ICD-10-CM

## 2014-12-19 DIAGNOSIS — Z720 Tobacco use: Secondary | ICD-10-CM

## 2014-12-19 DIAGNOSIS — F1020 Alcohol dependence, uncomplicated: Secondary | ICD-10-CM | POA: Diagnosis not present

## 2014-12-19 DIAGNOSIS — IMO0001 Reserved for inherently not codable concepts without codable children: Secondary | ICD-10-CM

## 2014-12-19 MED ORDER — ALBUTEROL SULFATE HFA 108 (90 BASE) MCG/ACT IN AERS: 2.0000 | INHALATION_SPRAY | RESPIRATORY_TRACT | Status: DC | PRN

## 2014-12-19 MED ORDER — ALBUTEROL SULFATE (2.5 MG/3ML) 0.083% IN NEBU: 2.5000 mg | INHALATION_SOLUTION | Freq: Three times a day (TID) | RESPIRATORY_TRACT | Status: DC

## 2014-12-19 MED ORDER — IPRATROPIUM BROMIDE 0.02 % IN SOLN: 0.5000 mg | Freq: Three times a day (TID) | RESPIRATORY_TRACT | Status: DC

## 2014-12-19 NOTE — Assessment & Plan Note (Signed)
She is cutting back on ETOH

## 2014-12-19 NOTE — Assessment & Plan Note (Signed)
At baseline which is still end stage, continue nebs, oxygen therapy

## 2014-12-19 NOTE — Progress Notes (Signed)
Patient ID: Leslie Drake, female   DOB: May 25, 1951, 64 y.o.   MRN: [id number]   Subjective:    Patient ID: Leslie Drake, female    DOB: [birth year], 64 y.o.   MRN: [id number]  Patient presents for 4 month F/U  Pt here to f/u chronic medical problems. No concerns, has nurse once a week to assist her. Wears oxygen 24 hours a day, using nebulizer as prescribed. No illness this past winter. Continues to smoke 6cig/ day, has cut ETOH down to 2-3 drinks a day. Declines preventative testing   Meds reviewed, she does take zantac as needed for heartburn, she recently started [employer] Also taking joint supplement due to stiffiness in joints  Review Of Systems:  GEN- denies fatigue, fever, weight loss,weakness, recent illness HEENT- denies eye drainage, change in vision, nasal discharge, CVS- denies chest pain, palpitations RESP- + SOB w/ exertion, +cough, wheeze ABD- denies N/V, change in stools, abd pain GU- denies dysuria, hematuria, dribbling, incontinence MSK- + joint pain, muscle aches, injury Neuro- denies headache, dizziness, syncope, seizure activity       Objective:    BP 122/76 mmHg  Pulse 100  Temp(Src) 98.1 F (36.7 C) (Oral)  Resp 22  Ht 5\' 4"  (1.626 m)  Wt 140 lb (63.504 kg)  BMI 24.02 kg/m2  SpO2 98% GEN- NAD, alert and oriented x3, chronically ill appearing HEENT- PERRL, EOMI, non injected sclera, pink conjunctiva, MMM, oropharynx clear Neck- Supple, no thyromegaly CVS- RRR, no murmur RESP-CTAB, no wheeze EXT- No edema Pulses- Radial, DP- 2+        Assessment & Plan:      Problem List Items Addressed This Visit      Unprioritized   Tobacco user   Emphysema/COPD - Primary      Note: This dictation was prepared with Dragon dictation along with smaller phrase technology. Any transcriptional errors that result from this process are unintentional.

## 2014-12-19 NOTE — Assessment & Plan Note (Signed)
Discussed need for tobacco cessation  

## 2014-12-19 NOTE — Patient Instructions (Signed)
Continue current medications F/U 6 months- PHYSICAL- FASTING ( AM appt)

## 2014-12-19 NOTE — Assessment & Plan Note (Signed)
Continue krill oil, will check on next visit

## 2015-01-01 ENCOUNTER — Telehealth: Payer: Self-pay | Admitting: Family Medicine

## 2015-01-01 NOTE — Telephone Encounter (Signed)
Patient left message saying she has some questions about her medications  [phone]

## 2015-01-01 NOTE — Telephone Encounter (Signed)
Returned call to patient.   Reports that she picked up inhaler and was given Proventil. States that she tried medication and noted tachycardia. Requested to change back to [employer] inhaler.  Call placed to pharmacy to request change.

## 2015-02-17 ENCOUNTER — Ambulatory Visit (INDEPENDENT_AMBULATORY_CARE_PROVIDER_SITE_OTHER): Payer: Medicare Other | Admitting: Family Medicine

## 2015-02-17 ENCOUNTER — Encounter: Payer: Self-pay | Admitting: Family Medicine

## 2015-02-17 VITALS — BP 128/74 | HR 97 | Temp 98.5°F | Resp 22 | Ht 64.0 in | Wt 141.0 lb

## 2015-02-17 DIAGNOSIS — J432 Centrilobular emphysema: Secondary | ICD-10-CM

## 2015-02-17 NOTE — Progress Notes (Signed)
Patient ID: Leslie Drake, female   DOB: 07/29/1951, 64 y.o.   MRN: [id number]   Subjective:    Patient ID: Leslie Drake, female    DOB: [birth year], 64 y.o.   MRN: [id number]  Patient presents for Breathing Test  patient here to follow-up COPD. She needs a new oxygen supplier. She has been doing fairly well at home. She was started on oxygen in 2013 when I diagnosed her with COPD/emphysema near end-stage. She had a pulmonary function test done in 2014. She's been maintained on nebulizer treatments secondary to difficulty using inhalers as well as reaction to Advair. She is still smoking. She currently wears 2 L 24 hours a day and if she is more short of breath she will increase to 3 L as needed.    Review Of Systems:  GEN- denies fatigue, fever, weight loss,weakness, recent illness HEENT- denies eye drainage, change in vision, nasal discharge, CVS- denies chest pain, palpitations RESP-+SOB, +cough, wheeze ABD- denies N/V, change in stools, abd pain Neuro- denies headache, dizziness, syncope, seizure activity       Objective:    BP 128/74 mmHg  Pulse 97  Temp(Src) 98.5 F (36.9 C) (Oral)  Resp 22  Ht 5\' 4"  (1.626 m)  Wt 141 lb (63.957 kg)  BMI 24.19 kg/m2  SpO2 96% GEN- NAD, alert and oriented x3 3 minute Walk- 87% on Room Air 96% on 2 Liters  HEENT- PERRL, EOMI, non injected sclera, pink conjunctiva, MMM, oropharynx clear Neck- Supple,  CVS- RRR, no murmur RESP-decreased at bases, no wheeze, normal WOB at rest, labored with exertion EXT- No edema Pulses- Radial  2+        Assessment & Plan:      Problem List Items Addressed This Visit    Emphysema/COPD - Primary      Note: This dictation was prepared with Dragon dictation along with smaller phrase technology. Any transcriptional errors that result from this process are unintentional.

## 2015-02-17 NOTE — Assessment & Plan Note (Addendum)
Currently stable, continue current meds Oxygen will be sent through [facility] She continues to benefit from oxygen therapy

## 2015-02-17 NOTE — Patient Instructions (Signed)
F/U as previous  Continue current medications  

## 2015-03-02 ENCOUNTER — Telehealth: Payer: Self-pay | Admitting: Family Medicine

## 2015-03-02 NOTE — Telephone Encounter (Signed)
noted 

## 2015-03-02 NOTE — Telephone Encounter (Signed)
Returned call to patient husband, Saxby.   Reports that patient requires Best Fit Evaluation to be completed in home.   Order printed and awaiting signature to be faxed.

## 2015-03-02 NOTE — Telephone Encounter (Deleted)
Patient husband calling to say that there needs to be an order for advanced home care for oxygen if possible to be brought to the house  [phone] he would like a call back about this

## 2015-03-05 NOTE — Telephone Encounter (Signed)
Received F/U call from [place] at [facility]   Reports that order received, but wording incorrect. States that order must state VERBATIM Oxygen @2LPM  continuous via [place]- portable, stationary and gas.  Also states that chart notes require documentation of the O2 saturation on RA at rest. Advised that following documentation easier to see:  At rest on room air =____% On exertion on room air =____% On exertion on ____LPM oxygen via [place] =____%  MD to be made aware.

## 2015-03-05 NOTE — Telephone Encounter (Signed)
Received call from [facility]. Advised that order cannot be completed for Best Fit Evaluation since patient has not begun services with them for oxygen.   Requested order for continuous O2. Orders faxed.

## 2015-03-06 ENCOUNTER — Telehealth: Payer: Self-pay | Admitting: Internal Medicine

## 2015-03-06 ENCOUNTER — Telehealth: Payer: Self-pay | Admitting: Family Medicine

## 2015-03-06 DIAGNOSIS — J432 Centrilobular emphysema: Secondary | ICD-10-CM

## 2015-03-06 NOTE — Telephone Encounter (Signed)
Spoke with husband. He has multiple questions about Xolair. Please give pt's husband a call.

## 2015-03-06 NOTE — Telephone Encounter (Signed)
This is something that the patient and her spouse can come to an appt with one of our MD's and discuss questions/concerns they have. She will be a new patient.   Please help make consult with pulmonary MD for patient.

## 2015-03-06 NOTE — Telephone Encounter (Signed)
MD please advise

## 2015-03-06 NOTE — Telephone Encounter (Signed)
Okay to send referral, send last OV notes from Dr. Luan Pulling her current pulmonologist

## 2015-03-06 NOTE — Telephone Encounter (Signed)
Patient would like a referral to [facility]  [phone]

## 2015-03-06 NOTE — Progress Notes (Addendum)
Oxygen dependency documentation clarified  At rest on room air = 87 % On exertion on room air = 87% On exertion on 2LPM oxygen via  =96%

## 2015-03-06 NOTE — Telephone Encounter (Signed)
Sept was the earliest appt. Pt's husband states that he will contact her PCP to see if they will refer to [place] instead of a self referral. Nothing further needed at his time.

## 2015-03-06 NOTE — Telephone Encounter (Signed)
Note updated

## 2015-03-07 NOTE — Telephone Encounter (Signed)
Referral order placed.

## 2015-04-08 ENCOUNTER — Other Ambulatory Visit: Payer: Self-pay | Admitting: Family Medicine

## 2015-04-15 ENCOUNTER — Institutional Professional Consult (permissible substitution): Payer: Self-pay | Admitting: Pulmonary Disease

## 2015-04-22 ENCOUNTER — Other Ambulatory Visit: Payer: Self-pay | Admitting: Family Medicine

## 2015-04-22 NOTE — Telephone Encounter (Signed)
Medication refilled per protocol. 

## 2015-06-12 ENCOUNTER — Other Ambulatory Visit: Payer: Self-pay | Admitting: Family Medicine

## 2015-06-12 NOTE — Telephone Encounter (Signed)
Refill appropriate and filled per protocol. 

## 2015-06-25 ENCOUNTER — Other Ambulatory Visit: Payer: Self-pay | Admitting: Family Medicine

## 2015-06-25 NOTE — Telephone Encounter (Signed)
Refill appropriate and filled per protocol. 

## 2015-06-26 ENCOUNTER — Encounter: Payer: Self-pay | Admitting: Family Medicine

## 2015-06-26 ENCOUNTER — Ambulatory Visit (INDEPENDENT_AMBULATORY_CARE_PROVIDER_SITE_OTHER): Payer: Medicare Other | Admitting: Family Medicine

## 2015-06-26 VITALS — BP 122/70 | HR 90 | Temp 97.6°F | Resp 18 | Ht 64.0 in | Wt 140.0 lb

## 2015-06-26 DIAGNOSIS — J432 Centrilobular emphysema: Secondary | ICD-10-CM | POA: Diagnosis not present

## 2015-06-26 DIAGNOSIS — Z23 Encounter for immunization: Secondary | ICD-10-CM | POA: Diagnosis not present

## 2015-06-26 DIAGNOSIS — Z1321 Encounter for screening for nutritional disorder: Secondary | ICD-10-CM | POA: Diagnosis not present

## 2015-06-26 DIAGNOSIS — E785 Hyperlipidemia, unspecified: Secondary | ICD-10-CM

## 2015-06-26 DIAGNOSIS — E038 Other specified hypothyroidism: Secondary | ICD-10-CM | POA: Diagnosis not present

## 2015-06-26 DIAGNOSIS — Z0000 Encounter for general adult medical examination without abnormal findings: Secondary | ICD-10-CM | POA: Diagnosis not present

## 2015-06-26 LAB — LIPID PANEL
Cholesterol: 245 mg/dL — ABNORMAL HIGH (ref 125–200)
HDL: 91 mg/dL (ref >=46)
LDL CALC: 140 mg/dL — AB (ref <130)
Total CHOL/HDL Ratio: 2.7 Ratio (ref <=5.0)
Triglycerides: 70 mg/dL (ref <150)
VLDL: 14 mg/dL (ref <30)

## 2015-06-26 LAB — COMPREHENSIVE METABOLIC PANEL
ALT: 16 U/L (ref 6–29)
AST: 22 U/L (ref 10–35)
Albumin: 4.3 g/dL (ref 3.6–5.1)
Alkaline Phosphatase: 108 U/L (ref 33–130)
BILIRUBIN TOTAL: 0.3 mg/dL (ref 0.2–1.2)
BUN: 11 mg/dL (ref 7–25)
CO2: 32 mmol/L — AB (ref 20–31)
CREATININE: 0.52 mg/dL (ref 0.50–0.99)
Calcium: 9 mg/dL (ref 8.6–10.4)
Chloride: 100 mmol/L (ref 98–110)
GLUCOSE: 86 mg/dL (ref 70–99)
Potassium: 4.3 mmol/L (ref 3.5–5.3)
SODIUM: 142 mmol/L (ref 135–146)
Total Protein: 6.7 g/dL (ref 6.1–8.1)

## 2015-06-26 LAB — CBC WITH DIFFERENTIAL/PLATELET
BASOS ABS: 0.1 10*3/uL (ref 0.0–0.1)
BASOS PCT: 1 % (ref 0–1)
EOS ABS: 0.2 10*3/uL (ref 0.0–0.7)
EOS PCT: 3 % (ref 0–5)
HCT: 42.7 % (ref 36.0–46.0)
Hemoglobin: 14 g/dL (ref 12.0–15.0)
Lymphocytes Relative: 35 % (ref 12–46)
Lymphs Abs: 2.2 10*3/uL (ref 0.7–4.0)
MCH: 30.6 pg (ref 26.0–34.0)
MCHC: 32.8 g/dL (ref 30.0–36.0)
MCV: 93.4 fL (ref 78.0–100.0)
MONO ABS: 0.4 10*3/uL (ref 0.1–1.0)
MPV: 9 fL (ref 8.6–12.4)
Monocytes Relative: 7 % (ref 3–12)
Neutro Abs: 3.5 10*3/uL (ref 1.7–7.7)
Neutrophils Relative %: 54 % (ref 43–77)
PLATELETS: 321 10*3/uL (ref 150–400)
RBC: 4.57 MIL/uL (ref 3.87–5.11)
RDW: 14.3 % (ref 11.5–15.5)
WBC: 6.4 10*3/uL (ref 4.0–10.5)

## 2015-06-26 NOTE — Progress Notes (Signed)
Patient ID: Leslie Drake, female   DOB: [birth year], 64 y.o.   MRN: [id number] Subjective:   Patient presents for Medicare Annual/Subsequent preventive examination.    Patient here for a wellness exam. She has no new concerns. She continues to have problems with sleeping but does not one any medication sleep aids. Her breathing has been good she has not had any chest colds or flares of her COPD recently. She is wearing her oxygen as prescribed. She declines any preventative screening. She will get flu shot Review Past Medical/Family/Social: Per EMR   Risk Factors  Current exercise habits: none Dietary issues discussed: yes  Cardiac risk factors: Hyperlipidemia  Depression Screen  (Note: if answer to either of the following is "Yes", a more complete depression screening is indicated)  Over the past two weeks, have you felt down, depressed or hopeless? No Over the past two weeks, have you felt little interest or pleasure in doing things? No Have you lost interest or pleasure in daily life? No Do you often feel hopeless? No Do you cry easily over simple problems? No   Activities of Daily Living  In your present state of health, do you have any difficulty performing the following activities?:  Driving? No  Managing money? No  Feeding yourself? No  Getting from bed to chair? No  Climbing a flight of stairs? No  Preparing food and eating?: No  Bathing or showering? No  Getting dressed: No  Getting to the toilet? No  Using the toilet:No  Moving around from place to place: yes In the past year have you fallen or had a near fall?:No  Are you sexually active? No  Do you have more than one partner? No   Hearing Difficulties: No  Do you often ask people to speak up or repeat themselves? No  Do you experience ringing or noises in your ears? No Do you have difficulty understanding soft or whispered voices? No  Do you feel that you have a problem with memory? No Do you often misplace  items? No  Do you feel safe at home? Yes  Cognitive Testing  Alert? Yes Normal Appearance?Yes  Oriented to person? Yes Place? Yes  Time? Yes  Recall of three objects? Yes  Can perform simple calculations? Yes  Displays appropriate judgment?Yes  Can read the correct time from a watch face?Yes   Screening Tests / Date DECLINES Colonoscopy                     Zostavax  Mammogram  Influenza Vaccine - GIVEN TODAY Tetanus/tdap  ROS: GEN- denies fatigue, fever, weight loss,weakness, recent illness HEENT- denies eye drainage, change in vision, nasal discharge, CVS- denies chest pain, palpitations RESP-+ SOB w/ exertion , cough, wheeze ABD- denies N/V, change in stools, abd pain GU- denies dysuria, hematuria, dribbling, incontinence MSK- denies joint pain, muscle aches, injury Neuro- denies headache, dizziness, syncope, seizure activity  PHYSICAL: GEN- NAD, alert and oriented x3 HEENT- PERRL, EOMI, non injected sclera, pink conjunctiva, MMM, oropharynx clear Neck- Supple, no thryomegaly CVS- RRR, no murmur RESP-occasional wheeze, normal WOB at rest, no rales, decreased at bases  ABD-NABS,soft,nt,nd  EXT- No edema Pulses- Radial, DP- 2+    Assessment:    Annual wellness medicare exam   Plan:    During the course of the visit the patient was educated and counseled about appropriate screening and preventive services including:  DECLINES SCREENING/PREVENTION Screening mammography  Colorectal cancer screening  Shingles vaccine.  Flu shot given/Prevnar 13   Fasting labs Diet review for nutrition referral? Yes ____ Not Indicated __x__  Patient Instructions (the written plan) was given to the patient.  Medicare Attestation  I have personally reviewed:  The patient's medical and social history  Their use of alcohol, tobacco or illicit drugs  Their current medications and supplements  The patient's functional ability including ADLs,fall risks, home safety risks, cognitive,  and hearing and visual impairment  Diet and physical activities  Evidence for depression or mood disorders  The patient's weight, height, BMI, and visual acuity have been recorded in the chart. I have made referrals, counseling, and provided education to the patient based on review of the above and I have provided the patient with a written personalized care plan for preventive services.

## 2015-06-26 NOTE — Assessment & Plan Note (Signed)
Recheck lipids Discussed natural melatonin for sleep

## 2015-06-26 NOTE — Patient Instructions (Signed)
Vitamin D 1000IU  Try the melatonin  Or Lavender/Chamomille tea We will call with lab results Prevnar 13 given  Flu shot given  F/U 6 months

## 2015-06-27 LAB — TSH: TSH: 3.504 u[IU]/mL (ref 0.350–4.500)

## 2015-06-30 ENCOUNTER — Encounter: Payer: Self-pay | Admitting: *Deleted

## 2015-07-09 ENCOUNTER — Other Ambulatory Visit: Payer: Self-pay | Admitting: Family Medicine

## 2015-07-09 NOTE — Telephone Encounter (Signed)
Refill appropriate and filled per protocol. 

## 2015-07-22 ENCOUNTER — Other Ambulatory Visit: Payer: Self-pay | Admitting: Family Medicine

## 2015-07-22 NOTE — Telephone Encounter (Signed)
Refill appropriate and filled per protocol. 

## 2015-08-19 ENCOUNTER — Other Ambulatory Visit: Payer: Self-pay | Admitting: Family Medicine

## 2015-12-28 ENCOUNTER — Encounter: Payer: Self-pay | Admitting: Family Medicine

## 2015-12-28 ENCOUNTER — Telehealth: Payer: Self-pay | Admitting: *Deleted

## 2015-12-28 ENCOUNTER — Ambulatory Visit (INDEPENDENT_AMBULATORY_CARE_PROVIDER_SITE_OTHER): Payer: Medicare Other | Admitting: Family Medicine

## 2015-12-28 VITALS — BP 124/70 | HR 90 | Temp 97.9°F | Resp 18 | Ht 64.0 in | Wt 131.0 lb

## 2015-12-28 DIAGNOSIS — R634 Abnormal weight loss: Secondary | ICD-10-CM | POA: Diagnosis not present

## 2015-12-28 DIAGNOSIS — E038 Other specified hypothyroidism: Secondary | ICD-10-CM

## 2015-12-28 DIAGNOSIS — E785 Hyperlipidemia, unspecified: Secondary | ICD-10-CM | POA: Diagnosis not present

## 2015-12-28 DIAGNOSIS — Z720 Tobacco use: Secondary | ICD-10-CM | POA: Diagnosis not present

## 2015-12-28 DIAGNOSIS — G4700 Insomnia, unspecified: Secondary | ICD-10-CM | POA: Insufficient documentation

## 2015-12-28 DIAGNOSIS — J432 Centrilobular emphysema: Secondary | ICD-10-CM | POA: Diagnosis not present

## 2015-12-28 MED ORDER — TRAZODONE HCL 50 MG PO TABS: 25.0000 mg | ORAL_TABLET | Freq: Every evening | ORAL | Status: DC | PRN

## 2015-12-28 MED ORDER — LEVALBUTEROL HCL 0.63 MG/3ML IN NEBU: 0.6300 mg | INHALATION_SOLUTION | RESPIRATORY_TRACT | Status: DC | PRN

## 2015-12-28 NOTE — Assessment & Plan Note (Signed)
I will avoid anything habit-forming as she does still have ongoing alcohol use which I think she is down playing.

## 2015-12-28 NOTE — Progress Notes (Signed)
Patient ID: Leslie Drake, female   DOB: 1951/05/24, 65 y.o.   MRN: [id number]    Subjective:    Patient ID: Leslie Drake, female    DOB: 1951-04-18, 65 y.o.   MRN: [id number]  Patient presents for 6 month F/U  here to follow-up chronic medical problems. She has end-stage COPD oxygen dependent. She is on nebulizer treatments.She states that the albuterol makes her very jittery this is the only medication she can tolerate for her lung disease. She's not been a restless sleep the past few months and she is so jittery. She's tried over-the-counter sleep aids with no improvement she only sleeps about 2-3 hours a day. In general she feels like her breathing is good. She does continue to smoke at least 8 cigarettes a day. She is wearing oxygen 2 L  Her weight is also down about 9 pounds since her last visit. She states that she has a good appetite. No she's not had any cancer prevention screening done and she declines this.  She is also being treated for acid reflux with Zantac  She also takes Krill oil for her cholesterol. She is due for repeat cholesterol check  Review Of Systems:  GEN- + fatigue, fever, weight loss,weakness, recent illness HEENT- denies eye drainage, change in vision, nasal discharge, CVS- denies chest pain, palpitations RESP-+ SOB, cough, wheeze ABD- denies N/V, change in stools, abd pain GU- denies dysuria, hematuria, dribbling, incontinence MSK- denies joint pain, muscle aches, injury Neuro- denies headache, dizziness, syncope, seizure activity       Objective:    BP 124/70 mmHg  Pulse 90  Temp(Src) 97.9 F (36.6 C) (Oral)  Resp 18  Ht 5\' 4"  (1.626 m)  Wt 131 lb (59.421 kg)  BMI 22.47 kg/m2  SpO2 96% GEN- NAD, alert and oriented x3,weight loss  HEENT- PERRL, EOMI, non injected sclera, pink conjunctiva, MMM, oropharynx clear Neck- Supple, no thyromegaly, no LAD  CVS- RRR, no murmur RESP-distant BS, normal WOB, no wheeze  ABD-NABS,soft,NT,ND EXT- No  edema Pulses- Radial  2+        Assessment & Plan:      Problem List Items Addressed This Visit    Tobacco user    Counseled on tobacco cessation she is not ready to quit      Insomnia    I will avoid anything habit-forming as she does still have ongoing alcohol use which I think she is down playing.      Hypothyroidism    We'll check her thyroid function studies. Her weight loss is concerned she states that she has a good appetite she does have risk factors for cancer but she declines any preventative medicine her screenings. 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      Relevant Orders   TSH   T3, free   T4, free   Hyperlipidemia - Primary    Recheck cholesterol labs      Relevant Orders   Lipid panel   Emphysema/COPD ([place])    End-stage COPD she does not tolerate inhaled steroids should not tolerate anticholinergics however the albuterol is making her jittery and she is using it at least 4 times a day. We'll try her on Xopenex and see if this helps Unfortunately she continues to smoke. She's also still using alcohol and seems very reluctant to see this as a problem.      Relevant Medications   levalbuterol (XOPENEX) 0.63 MG/3ML nebulizer solution    Other Visit  Diagnoses    Loss of weight        Relevant Orders    CBC with Differential/Platelet    Comprehensive metabolic panel       Note: This dictation was prepared with Dragon dictation along with smaller phrase technology. Any transcriptional errors that result from this process are unintentional.

## 2015-12-28 NOTE — Patient Instructions (Addendum)
F/U 6 months We will call with lab results Try the xopenex for your breathing Try trazodone for sleep, take 1/2 tablet first few months

## 2015-12-28 NOTE — Assessment & Plan Note (Signed)
Counseled on tobacco cessation she is not ready to quit 

## 2015-12-28 NOTE — Assessment & Plan Note (Signed)
End-stage COPD she does not tolerate inhaled steroids should not tolerate anticholinergics however the albuterol is making her jittery and she is using it at least 4 times a day. We'll try her on Xopenex and see if this helps Unfortunately she continues to smoke. She's also still using alcohol and seems very reluctant to see this as a problem.

## 2015-12-28 NOTE — Telephone Encounter (Signed)
Received request from pharmacy for [place] on [address].   PA submitted.   Dx: J43.2- Emphysema/ COPD

## 2015-12-28 NOTE — Assessment & Plan Note (Signed)
We'll check her thyroid function studies. Her weight loss is concerned she states that she has a good appetite she does have risk factors for cancer but she declines any preventative medicine her screenings. 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

## 2015-12-28 NOTE — Assessment & Plan Note (Signed)
Recheck cholesterol labs 

## 2015-12-29 ENCOUNTER — Other Ambulatory Visit: Payer: Self-pay | Admitting: *Deleted

## 2015-12-29 DIAGNOSIS — R945 Abnormal results of liver function studies: Principal | ICD-10-CM

## 2015-12-29 DIAGNOSIS — R7989 Other specified abnormal findings of blood chemistry: Secondary | ICD-10-CM

## 2015-12-29 LAB — CBC WITH DIFFERENTIAL/PLATELET
BASOS PCT: 1 %
Basophils Absolute: 55 cells/uL (ref 0–200)
EOS PCT: 4 %
Eosinophils Absolute: 220 cells/uL (ref 15–500)
HCT: 44.2 % (ref 35.0–45.0)
HEMOGLOBIN: 14.3 g/dL (ref 12.0–15.0)
LYMPHS ABS: 1760 {cells}/uL (ref 850–3900)
Lymphocytes Relative: 32 %
MCH: 31.6 pg (ref 27.0–33.0)
MCHC: 32.4 g/dL (ref 32.0–36.0)
MCV: 97.6 fL (ref 80.0–100.0)
MONO ABS: 550 {cells}/uL (ref 200–950)
MPV: 8.9 fL (ref 7.5–12.5)
Monocytes Relative: 10 %
NEUTROS ABS: 2915 {cells}/uL (ref 1500–7800)
NEUTROS PCT: 53 %
Platelets: 298 10*3/uL (ref 140–400)
RBC: 4.53 MIL/uL (ref 3.80–5.10)
RDW: 14.6 % (ref 11.0–15.0)
WBC: 5.5 10*3/uL (ref 3.8–10.8)

## 2015-12-29 LAB — LIPID PANEL
CHOL/HDL RATIO: 2.2 ratio (ref <=5.0)
Cholesterol: 225 mg/dL — ABNORMAL HIGH (ref 125–200)
HDL: 103 mg/dL (ref >=46)
LDL CALC: 106 mg/dL (ref <130)
TRIGLYCERIDES: 80 mg/dL (ref <150)
VLDL: 16 mg/dL (ref <30)

## 2015-12-29 LAB — COMPREHENSIVE METABOLIC PANEL
ALBUMIN: 4.1 g/dL (ref 3.6–5.1)
ALK PHOS: 108 U/L (ref 33–130)
ALT: 34 U/L — AB (ref 6–29)
AST: 44 U/L — AB (ref 10–35)
BILIRUBIN TOTAL: 0.4 mg/dL (ref 0.2–1.2)
BUN: 7 mg/dL (ref 7–25)
CO2: 32 mmol/L — AB (ref 20–31)
CREATININE: 0.57 mg/dL (ref 0.50–0.99)
Calcium: 9.5 mg/dL (ref 8.6–10.4)
Chloride: 99 mmol/L (ref 98–110)
GLUCOSE: 82 mg/dL (ref 70–99)
Potassium: 4.7 mmol/L (ref 3.5–5.3)
SODIUM: 141 mmol/L (ref 135–146)
Total Protein: 6.6 g/dL (ref 6.1–8.1)

## 2015-12-29 LAB — T4, FREE: Free T4: 1.1 ng/dL (ref 0.8–1.8)

## 2015-12-29 LAB — T3, FREE: T3, Free: 3.1 pg/mL (ref 2.3–4.2)

## 2015-12-29 LAB — TSH: TSH: 2.83 mIU/L

## 2015-12-29 NOTE — Telephone Encounter (Signed)
Received fax from insurance requesting further information.   Call placed to [facility]. Was advised that medication should be run under part B (medical). Pharmacy made aware.

## 2016-01-05 ENCOUNTER — Other Ambulatory Visit: Payer: Self-pay | Admitting: Family Medicine

## 2016-01-05 NOTE — Telephone Encounter (Signed)
Refill appropriate and filled per protocol. 

## 2016-01-18 ENCOUNTER — Other Ambulatory Visit: Payer: Self-pay | Admitting: Family Medicine

## 2016-03-11 ENCOUNTER — Other Ambulatory Visit: Payer: Self-pay | Admitting: Family Medicine

## 2016-03-11 IMAGING — CR DG CHEST 1V PORT
1 series · 2 of 2 positions shown · non-contrast
Comparison: May 23, 2012

CLINICAL DATA: Cough and difficulty breathing

EXAM:
PORTABLE CHEST - 1 VIEW

[Series 1: portable · 0.17mm/px · 2 of 2 slices shown]
[im 1/2]
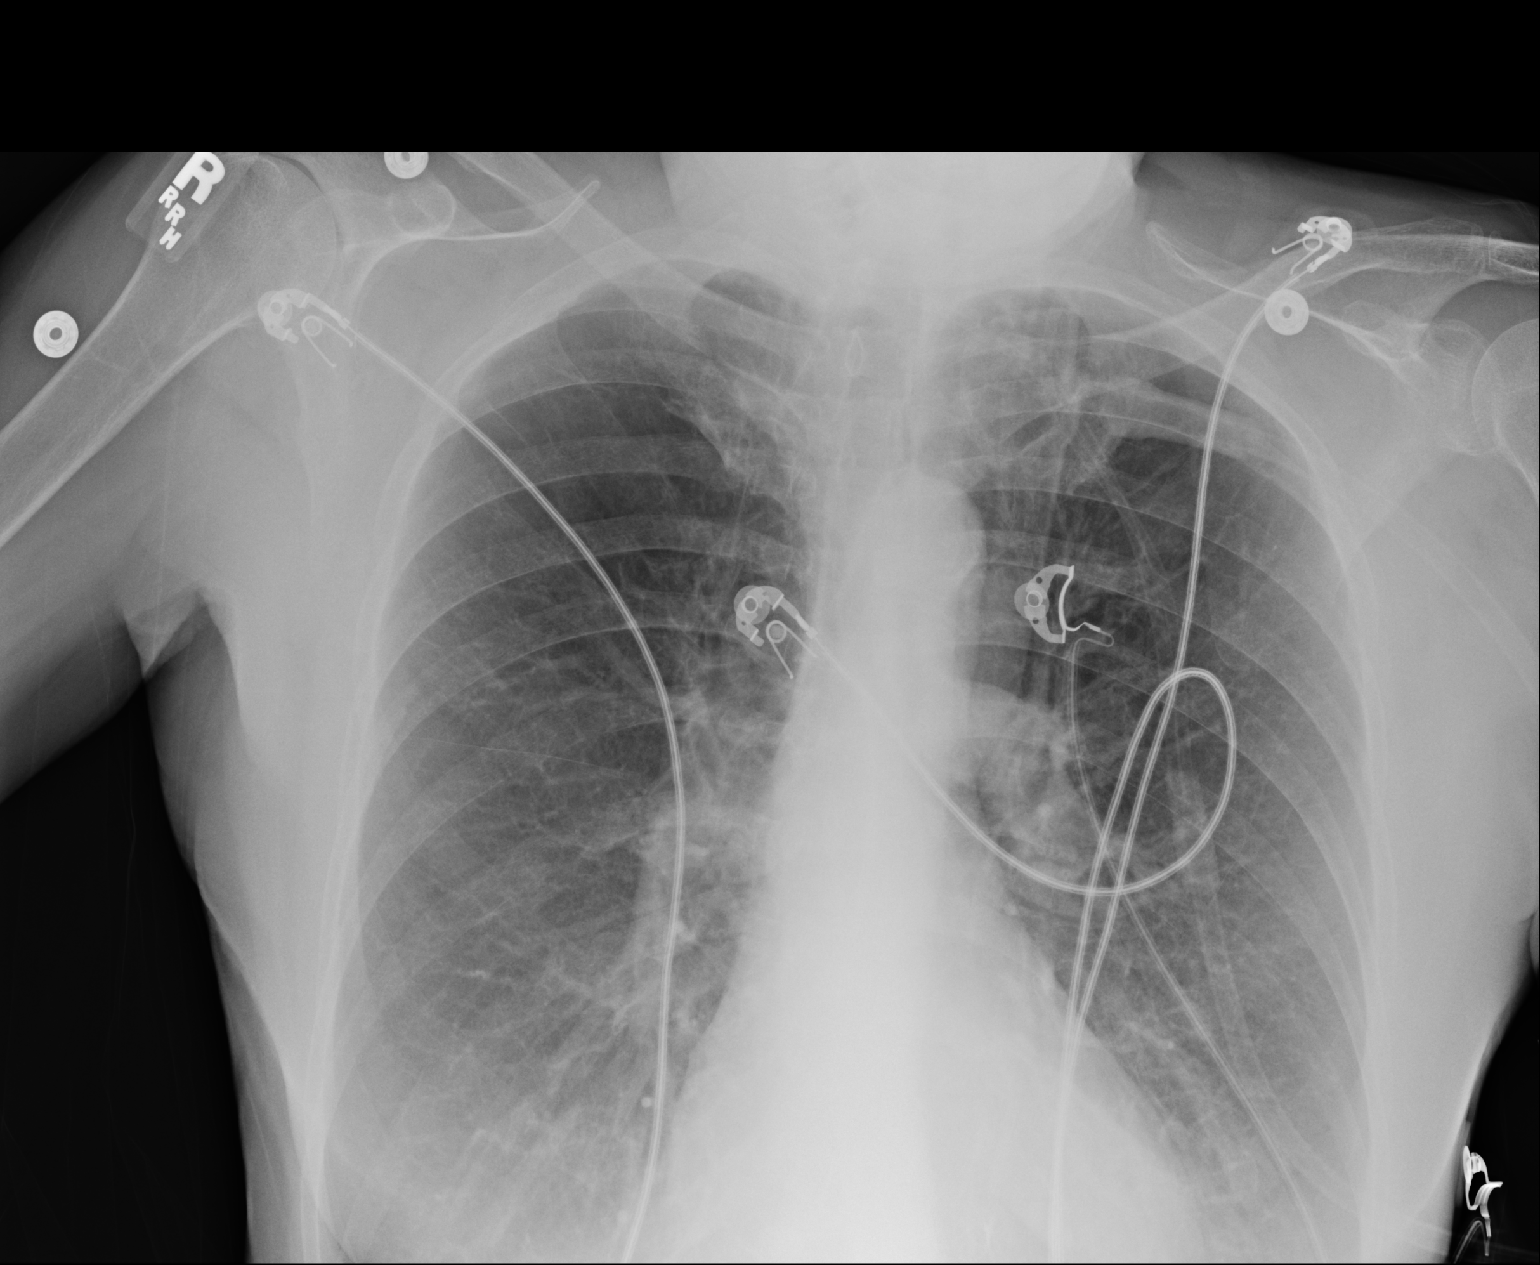
[im 2/2]
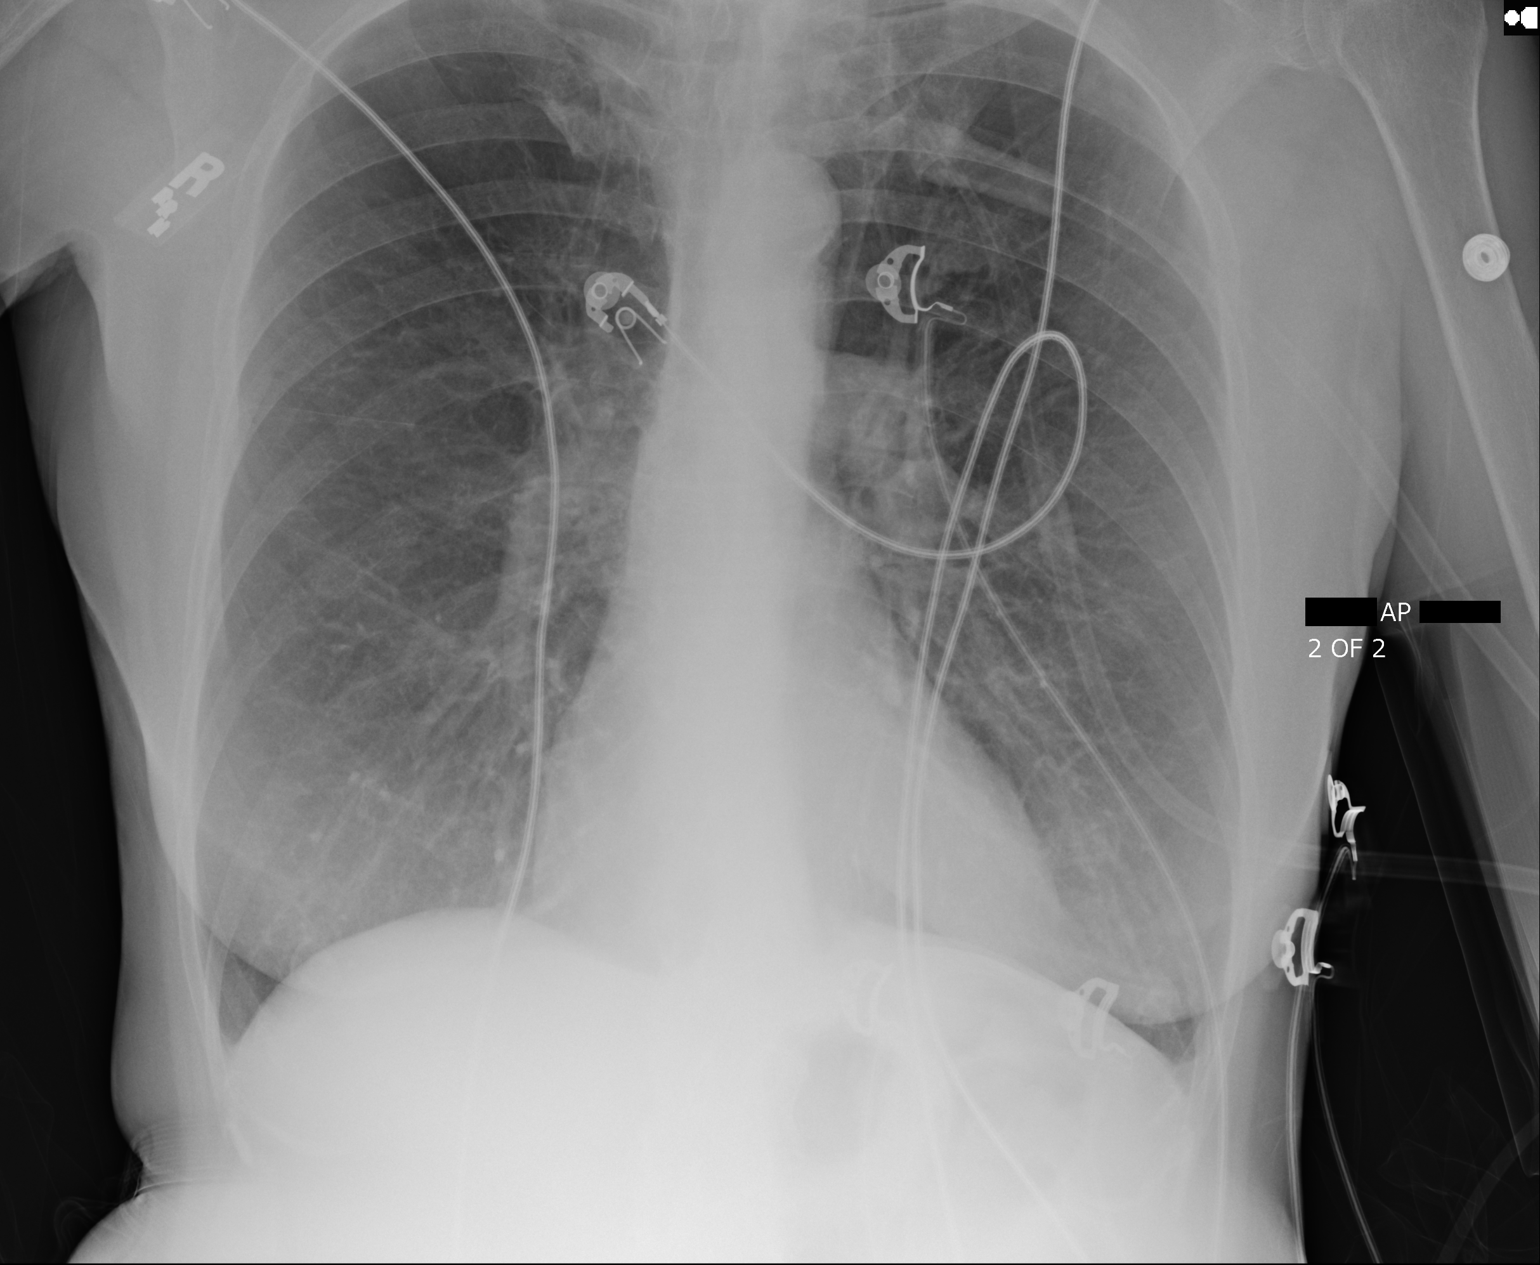

[2 of 2 positions shown; findings below may reference images not displayed]

FINDINGS: There is underlying emphysematous change. There is no edema or
consolidation. The heart size is normal. Pulmonary vascularity
reflects underlying emphysema. No adenopathy.
IMPRESSION: Underlying emphysematous change.  No edema or consolidation.

## 2016-03-11 NOTE — Telephone Encounter (Signed)
Refill appropriate and filled per protocol. 

## 2016-03-25 ENCOUNTER — Other Ambulatory Visit: Payer: Self-pay | Admitting: Family Medicine

## 2016-03-25 NOTE — Telephone Encounter (Signed)
Refill appropriate and filled per protocol. 

## 2016-04-08 ENCOUNTER — Other Ambulatory Visit: Payer: Self-pay | Admitting: Family Medicine

## 2016-04-21 ENCOUNTER — Other Ambulatory Visit: Payer: Self-pay | Admitting: Family Medicine

## 2016-04-21 NOTE — Telephone Encounter (Signed)
Refill appropriate and filled per protocol. 

## 2016-05-04 ENCOUNTER — Other Ambulatory Visit: Payer: Self-pay | Admitting: Family Medicine

## 2016-05-05 NOTE — Telephone Encounter (Signed)
Refill appropriate and filled per protocol. 

## 2016-05-19 ENCOUNTER — Other Ambulatory Visit: Payer: Self-pay | Admitting: Family Medicine

## 2016-06-02 ENCOUNTER — Other Ambulatory Visit: Payer: Self-pay | Admitting: Family Medicine

## 2016-06-16 ENCOUNTER — Other Ambulatory Visit: Payer: Self-pay | Admitting: Family Medicine

## 2016-06-28 ENCOUNTER — Ambulatory Visit: Payer: Medicare Other | Admitting: Family Medicine

## 2016-06-28 ENCOUNTER — Other Ambulatory Visit: Payer: Self-pay | Admitting: Family Medicine

## 2016-06-28 NOTE — Telephone Encounter (Signed)
Medication refilled per protocol. 

## 2016-07-06 ENCOUNTER — Ambulatory Visit (INDEPENDENT_AMBULATORY_CARE_PROVIDER_SITE_OTHER): Payer: Medicare Other | Admitting: Family Medicine

## 2016-07-06 ENCOUNTER — Encounter: Payer: Self-pay | Admitting: Family Medicine

## 2016-07-06 VITALS — BP 122/68 | HR 96 | Temp 98.2°F | Resp 20 | Ht 64.0 in | Wt 133.0 lb

## 2016-07-06 DIAGNOSIS — Z9981 Dependence on supplemental oxygen: Secondary | ICD-10-CM | POA: Diagnosis not present

## 2016-07-06 DIAGNOSIS — E038 Other specified hypothyroidism: Secondary | ICD-10-CM

## 2016-07-06 DIAGNOSIS — E7800 Pure hypercholesterolemia, unspecified: Secondary | ICD-10-CM | POA: Diagnosis not present

## 2016-07-06 DIAGNOSIS — Z23 Encounter for immunization: Secondary | ICD-10-CM

## 2016-07-06 DIAGNOSIS — J432 Centrilobular emphysema: Secondary | ICD-10-CM

## 2016-07-06 LAB — COMPREHENSIVE METABOLIC PANEL
ALT: 33 U/L — ABNORMAL HIGH (ref 6–29)
AST: 45 U/L — ABNORMAL HIGH (ref 10–35)
Albumin: 4.5 g/dL (ref 3.6–5.1)
Alkaline Phosphatase: 129 U/L (ref 33–130)
BUN: 8 mg/dL (ref 7–25)
CHLORIDE: 101 mmol/L (ref 98–110)
CO2: 30 mmol/L (ref 20–31)
CREATININE: 0.58 mg/dL (ref 0.50–0.99)
Calcium: 9.7 mg/dL (ref 8.6–10.4)
GLUCOSE: 86 mg/dL (ref 70–99)
Potassium: 4.8 mmol/L (ref 3.5–5.3)
SODIUM: 141 mmol/L (ref 135–146)
Total Bilirubin: 0.5 mg/dL (ref 0.2–1.2)
Total Protein: 7.3 g/dL (ref 6.1–8.1)

## 2016-07-06 LAB — LIPID PANEL
CHOL/HDL RATIO: 2.3 ratio (ref <=5.0)
CHOLESTEROL: 259 mg/dL — AB (ref 125–200)
HDL: 114 mg/dL (ref >=46)
LDL Cholesterol: 128 mg/dL (ref <130)
TRIGLYCERIDES: 83 mg/dL (ref <150)
VLDL: 17 mg/dL (ref <30)

## 2016-07-06 LAB — CBC WITH DIFFERENTIAL/PLATELET
BASOS ABS: 74 {cells}/uL (ref 0–200)
Basophils Relative: 1 %
EOS PCT: 3 %
Eosinophils Absolute: 222 cells/uL (ref 15–500)
HCT: 46 % — ABNORMAL HIGH (ref 35.0–45.0)
Hemoglobin: 15.3 g/dL — ABNORMAL HIGH (ref 12.0–15.0)
Lymphocytes Relative: 27 %
Lymphs Abs: 1998 cells/uL (ref 850–3900)
MCH: 32 pg (ref 27.0–33.0)
MCHC: 33.3 g/dL (ref 32.0–36.0)
MCV: 96.2 fL (ref 80.0–100.0)
MONOS PCT: 5 %
MPV: 8.9 fL (ref 7.5–12.5)
Monocytes Absolute: 370 cells/uL (ref 200–950)
NEUTROS ABS: 4736 {cells}/uL (ref 1500–7800)
NEUTROS PCT: 64 %
PLATELETS: 247 10*3/uL (ref 140–400)
RBC: 4.78 MIL/uL (ref 3.80–5.10)
RDW: 14 % (ref 11.0–15.0)
WBC: 7.4 10*3/uL (ref 3.8–10.8)

## 2016-07-06 LAB — TSH: TSH: 3.09 mIU/L

## 2016-07-06 MED ORDER — ALBUTEROL SULFATE (2.5 MG/3ML) 0.083% IN NEBU: INHALATION_SOLUTION | RESPIRATORY_TRACT | 11 refills | Status: DC

## 2016-07-06 MED ORDER — ALBUTEROL SULFATE HFA 108 (90 BASE) MCG/ACT IN AERS: INHALATION_SPRAY | RESPIRATORY_TRACT | 11 refills | Status: DC

## 2016-07-06 NOTE — Patient Instructions (Signed)
Referral to palliative care F/U 1 year

## 2016-07-06 NOTE — Assessment & Plan Note (Addendum)
Her COPD is end-stage and she is not making any improvement. She does not want to see any specialist she does not want to change in medications. She's basically be being maintained very poorly unfortunately on fast acting bronchodilators and her oxygen. I discussed with her going into palliative care and she agrees with this. I think if we can have some other eyes in the home this will be helpful. At this point we will try to get her in once a year she declines further  She does have an aid who is with her 3 times a week and her husband is also there to help with care

## 2016-07-06 NOTE — Progress Notes (Signed)
Subjective:    Patient ID: Leslie Drake, female    DOB: 1951/06/24, 65 y.o.   MRN: [id number]  Patient presents for 6 month F/U (is fasting)  Patient here for follow-up. She has severe end-stage COPD/emphysema she is on 2-3 L of oxygen she uses nebulizers as her treatments. She can only tolerate albuterol and Atrovent She does not tolerate the steroids she does not want to see a lung doctor again. She is unable to truly leave her home without significant respiratory distress. She is wondering to only come once a year or honestly not at all because she is having difficulties. She would like to change from the Xopenex back to the albuterol she feels like the albuterol did better for her. When describing her typical day she pre-sits most of the day she is able to get up to the bathroom in her kitchen but this is still taxing on her. She does not leave the house otherwise states that she is content with this.  Review Of Systems:  GEN- denies fatigue, fever, weight loss,weakness, recent illness HEENT- denies eye drainage, change in vision, nasal discharge, CVS- denies chest pain, palpitations RESP- denies SOB, cough, wheeze ABD- denies N/V, change in stools, abd pain GU- denies dysuria, hematuria, dribbling, incontinence MSK- denies joint pain, muscle aches, injury Neuro- denies headache, dizziness, syncope, seizure activity       Objective:    BP 122/68 (BP Location: Left Arm, Patient Position: Sitting, Cuff Size: Normal)   Pulse 96   Temp 98.2 F (36.8 C) (Oral)   Resp 20   Ht 5\' 4"  (1.626 m)   Wt 133 lb (60.3 kg)   SpO2 97% Comment: 2 L/min via [place]  BMI 22.83 kg/m  GEN- NAD, alert and oriented x3 HEENT- PERRL, EOMI, non injected sclera, pink conjunctiva, MMM, oropharynx clear Neck- Supple, no thyromegaly CVS- RRR, no murmur RESP-distant BS, normal WOB, at rest, SOB with little exertion,  no wheeze  ABD-NABS,soft,NT,ND EXT- No edema Pulses- Radial  2+         Assessment & Plan:      Problem List Items Addressed This Visit    Hypothyroidism - Primary   Relevant Orders   TSH   Hyperlipidemia   Relevant Orders   CBC with Differential/Platelet   Comprehensive metabolic panel   Lipid panel   Emphysema/COPD ([facility])    Her COPD is end-stage and she is not making any improvement. She does not want to see any specialist she does not want to change in medications. She's basically be being maintained very poorly unfortunately on fast acting bronchodilators and her oxygen. I discussed with her going into palliative care and she agrees with this. I think if we can have some other eyes in the home this will be helpful. At this point we will try to get her in once a year she declines further  She does have an aid who is with her 3 times a week and her husband is also there to help with care      Relevant Medications   albuterol (PROVENTIL) (2.5 MG/3ML) 0.083% nebulizer solution   albuterol (PROAIR HFA) 108 (90 Base) MCG/ACT inhaler   Other Relevant Orders   Amb Referral to Palliative Care    Other Visit Diagnoses    Need for prophylactic vaccination and inoculation against influenza       Relevant Orders   Flu Vaccine QUAD 36+ mos PF IM (Fluarix & Fluzone Quad PF) (Completed)  Oxygen dependent       Relevant Orders   Amb Referral to Palliative Care      Note: This dictation was prepared with Dragon dictation along with smaller phrase technology. Any transcriptional errors that result from this process are unintentional.

## 2016-07-07 ENCOUNTER — Other Ambulatory Visit: Payer: Self-pay | Admitting: *Deleted

## 2016-07-07 MED ORDER — ALBUTEROL SULFATE (2.5 MG/3ML) 0.083% IN NEBU: INHALATION_SOLUTION | RESPIRATORY_TRACT | 11 refills | Status: DC

## 2016-07-07 NOTE — Telephone Encounter (Signed)
Received fax requesting refill on Albuterol.   Advised that DX is required for medicare billing.   Prescription sent to pharmacy.

## 2016-07-08 ENCOUNTER — Telehealth: Payer: Self-pay | Admitting: *Deleted

## 2016-07-08 DIAGNOSIS — J449 Chronic obstructive pulmonary disease, unspecified: Secondary | ICD-10-CM | POA: Diagnosis not present

## 2016-07-08 DIAGNOSIS — J439 Emphysema, unspecified: Secondary | ICD-10-CM | POA: Diagnosis not present

## 2016-07-08 DIAGNOSIS — R531 Weakness: Secondary | ICD-10-CM | POA: Diagnosis not present

## 2016-07-08 DIAGNOSIS — K219 Gastro-esophageal reflux disease without esophagitis: Secondary | ICD-10-CM | POA: Diagnosis not present

## 2016-07-08 DIAGNOSIS — F411 Generalized anxiety disorder: Secondary | ICD-10-CM | POA: Diagnosis not present

## 2016-07-08 DIAGNOSIS — R0602 Shortness of breath: Secondary | ICD-10-CM | POA: Diagnosis not present

## 2016-07-08 DIAGNOSIS — R5383 Other fatigue: Secondary | ICD-10-CM | POA: Diagnosis not present

## 2016-07-08 MED ORDER — LORAZEPAM 0.5 MG PO TABS: 0.5000 mg | ORAL_TABLET | Freq: Three times a day (TID) | ORAL | 0 refills | Status: DC | PRN

## 2016-07-08 NOTE — Telephone Encounter (Signed)
Medication called to pharmacy. 

## 2016-07-08 NOTE — Telephone Encounter (Signed)
Okay to send in script every 8 hours as needed

## 2016-07-08 NOTE — Telephone Encounter (Signed)
Received call from [place], [place] with [facility] [phone], ext [phone].  States that patient was admitted to care today (Dx: COPD).   Reports that patient states that she has frequent panic attacks at home. Requested MD to prescribe Ativan 0.5mg  Q4 hrs PRN for anxiety. If approved, requested prescription to be sent [place] for delivery.   MD please advise.

## 2016-07-11 ENCOUNTER — Telehealth: Payer: Self-pay

## 2016-07-11 NOTE — Telephone Encounter (Signed)
Noted, I was actually trying to get her in there palliative care COPD program, not Hospice, these should be two different programs, can you check on this

## 2016-07-11 NOTE — Telephone Encounter (Signed)
Leslie Drake called from hospice and stated Leslie Drake has been contacted and they will meet with her to admit to hospice.  Any questions call [phone]

## 2016-07-11 NOTE — Telephone Encounter (Signed)
I called Hospice, spoke with [facility].  They did see patient and admitted her on 07/08/16 for Hospice care.  They explained all services available to her.  She agreed to go under Hospice care but only agreed to nurse visits every couple a weeks.  She then called Hospice back today and canceled all services.

## 2016-07-28 ENCOUNTER — Telehealth: Payer: Self-pay | Admitting: Family Medicine

## 2016-07-28 ENCOUNTER — Other Ambulatory Visit: Payer: Self-pay | Admitting: Family Medicine

## 2016-07-28 DIAGNOSIS — J438 Other emphysema: Secondary | ICD-10-CM

## 2016-07-28 DIAGNOSIS — J441 Chronic obstructive pulmonary disease with (acute) exacerbation: Secondary | ICD-10-CM

## 2016-07-28 NOTE — Telephone Encounter (Signed)
Noted.   MD to be made aware.

## 2016-07-28 NOTE — Telephone Encounter (Signed)
I have been trying to look for just "Palliative" care.  [facility] says [facility] can do [facility] care.  Would send a NP to home monthly to see patient.  All care would still  Be coordinated and approved through you.  I called patient, she was unsure, still felt it would be Hospice and "not ready to give up"  [facility] just wants to put her on medications she does not want to take.  Told her Palliative nurse would not be able to do anything without your approval.  She then told me husband was looking at [facility]"  Which is a doctor who makes home visits.  Told her to let me know if needed any assistance

## 2016-07-28 NOTE — Telephone Encounter (Signed)
Jeneen Rinks husband of patient called and states that the patient wants Dr. Buelah Manis to stop sending Hospice out she isn't ready for hospice to come each week. He states patient is very upset and doesn't want to talk to anyone at this time. She thought hospice would come in every 6 months or so. He is requesting [place] to stop sending anyone out. He did say if his wife's health has declined that badly he should know about it. I informed he we didn't have a DPR on file and that the patient would need to fill that out in order for [place] to talk with him. He said she wouldn't be coming back here not b/c she didn't love Dr. Buelah Manis it was because she didn't want to talk to anyone.   CB# [phone]

## 2016-07-29 NOTE — Telephone Encounter (Signed)
Also discussed with patient's husband. They do not want palliative care or hospice at this time. I did not plan to put her on hospice anyway was more palliative for her breathing so he could have some eyes in the home. She became upset when hospice nurse tried to put her morphine for air hunger. They're looking into home base practice but do not plan to do anything until after the new year. They will contact me at that time.  All referrals have been stopped

## 2016-10-25 ENCOUNTER — Other Ambulatory Visit: Payer: Self-pay | Admitting: Family Medicine

## 2016-10-25 NOTE — Telephone Encounter (Signed)
okay

## 2016-10-25 NOTE — Telephone Encounter (Signed)
LRF 12/28/15 #30 + 3   LOV 07/06/16  OK refill?

## 2016-10-25 NOTE — Telephone Encounter (Signed)
Medication refilled per protocol. 

## 2017-01-12 ENCOUNTER — Other Ambulatory Visit: Payer: Self-pay | Admitting: Family Medicine

## 2017-01-27 ENCOUNTER — Other Ambulatory Visit: Payer: Self-pay | Admitting: Family Medicine

## 2017-02-09 ENCOUNTER — Other Ambulatory Visit: Payer: Self-pay | Admitting: Family Medicine

## 2017-02-23 ENCOUNTER — Other Ambulatory Visit: Payer: Self-pay | Admitting: Family Medicine

## 2017-03-17 ENCOUNTER — Other Ambulatory Visit: Payer: Self-pay | Admitting: Family Medicine

## 2017-04-04 ENCOUNTER — Other Ambulatory Visit: Payer: Self-pay | Admitting: Family Medicine

## 2017-04-20 ENCOUNTER — Other Ambulatory Visit: Payer: Self-pay | Admitting: Family Medicine

## 2017-05-05 ENCOUNTER — Other Ambulatory Visit: Payer: Self-pay | Admitting: Family Medicine

## 2017-05-19 ENCOUNTER — Other Ambulatory Visit: Payer: Self-pay | Admitting: Family Medicine

## 2017-05-19 ENCOUNTER — Other Ambulatory Visit: Payer: Self-pay | Admitting: *Deleted

## 2017-05-19 MED ORDER — ALBUTEROL SULFATE HFA 108 (90 BASE) MCG/ACT IN AERS: INHALATION_SPRAY | RESPIRATORY_TRACT | 0 refills | Status: DC

## 2017-05-21 ENCOUNTER — Other Ambulatory Visit: Payer: Self-pay | Admitting: Family Medicine

## 2017-05-31 ENCOUNTER — Other Ambulatory Visit: Payer: Self-pay | Admitting: Family Medicine

## 2017-06-14 ENCOUNTER — Other Ambulatory Visit: Payer: Self-pay | Admitting: Family Medicine

## 2017-06-26 ENCOUNTER — Other Ambulatory Visit: Payer: Self-pay | Admitting: Family Medicine

## 2017-06-29 ENCOUNTER — Other Ambulatory Visit: Payer: Self-pay | Admitting: Family Medicine

## 2017-07-10 ENCOUNTER — Encounter: Payer: Self-pay | Admitting: Family Medicine

## 2017-07-10 ENCOUNTER — Ambulatory Visit (INDEPENDENT_AMBULATORY_CARE_PROVIDER_SITE_OTHER): Payer: Medicare Other | Admitting: Family Medicine

## 2017-07-10 VITALS — BP 132/78 | HR 100 | Temp 98.2°F | Resp 22 | Ht 64.0 in | Wt 129.0 lb

## 2017-07-10 DIAGNOSIS — E7800 Pure hypercholesterolemia, unspecified: Secondary | ICD-10-CM | POA: Diagnosis not present

## 2017-07-10 DIAGNOSIS — Z720 Tobacco use: Secondary | ICD-10-CM | POA: Diagnosis not present

## 2017-07-10 DIAGNOSIS — E038 Other specified hypothyroidism: Secondary | ICD-10-CM | POA: Diagnosis not present

## 2017-07-10 DIAGNOSIS — G4700 Insomnia, unspecified: Secondary | ICD-10-CM

## 2017-07-10 DIAGNOSIS — E440 Moderate protein-calorie malnutrition: Secondary | ICD-10-CM

## 2017-07-10 DIAGNOSIS — Z23 Encounter for immunization: Secondary | ICD-10-CM | POA: Diagnosis not present

## 2017-07-10 DIAGNOSIS — J432 Centrilobular emphysema: Secondary | ICD-10-CM | POA: Diagnosis not present

## 2017-07-10 DIAGNOSIS — E46 Unspecified protein-calorie malnutrition: Secondary | ICD-10-CM | POA: Insufficient documentation

## 2017-07-10 LAB — COMPREHENSIVE METABOLIC PANEL
AG Ratio: 1.7 (calc) (ref 1.0–2.5)
ALBUMIN MSPROF: 4.2 g/dL (ref 3.6–5.1)
ALKALINE PHOSPHATASE (APISO): 147 U/L — AB (ref 33–130)
ALT: 94 U/L — ABNORMAL HIGH (ref 6–29)
AST: 130 U/L — ABNORMAL HIGH (ref 10–35)
BILIRUBIN TOTAL: 0.6 mg/dL (ref 0.2–1.2)
BUN: 8 mg/dL (ref 7–25)
CALCIUM: 9.5 mg/dL (ref 8.6–10.4)
CHLORIDE: 99 mmol/L (ref 98–110)
CO2: 35 mmol/L — ABNORMAL HIGH (ref 20–32)
Creat: 0.52 mg/dL (ref 0.50–0.99)
GLOBULIN: 2.5 g/dL (ref 1.9–3.7)
Glucose, Bld: 85 mg/dL (ref 65–99)
POTASSIUM: 4.7 mmol/L (ref 3.5–5.3)
Sodium: 143 mmol/L (ref 135–146)
Total Protein: 6.7 g/dL (ref 6.1–8.1)

## 2017-07-10 LAB — CBC WITH DIFFERENTIAL/PLATELET
BASOS PCT: 1.7 %
Basophils Absolute: 111 cells/uL (ref 0–200)
Eosinophils Absolute: 98 cells/uL (ref 15–500)
Eosinophils Relative: 1.5 %
HEMATOCRIT: 48.8 % — AB (ref 35.0–45.0)
Hemoglobin: 16.2 g/dL — ABNORMAL HIGH (ref 11.7–15.5)
LYMPHS ABS: 1801 {cells}/uL (ref 850–3900)
MCH: 32.2 pg (ref 27.0–33.0)
MCHC: 33.2 g/dL (ref 32.0–36.0)
MCV: 97 fL (ref 80.0–100.0)
MPV: 9.9 fL (ref 7.5–12.5)
Monocytes Relative: 6.8 %
NEUTROS PCT: 62.3 %
Neutro Abs: 4050 cells/uL (ref 1500–7800)
Platelets: 225 10*3/uL (ref 140–400)
RBC: 5.03 10*6/uL (ref 3.80–5.10)
RDW: 13.3 % (ref 11.0–15.0)
Total Lymphocyte: 27.7 %
WBC: 6.5 10*3/uL (ref 3.8–10.8)
WBCMIX: 442 {cells}/uL (ref 200–950)

## 2017-07-10 LAB — T3, FREE: T3 FREE: 3 pg/mL (ref 2.3–4.2)

## 2017-07-10 LAB — LIPID PANEL
CHOLESTEROL: 262 mg/dL — AB (ref <200)
HDL: 134 mg/dL (ref >50)
LDL Cholesterol (Calc): 110 mg/dL (calc) — ABNORMAL HIGH
Non-HDL Cholesterol (Calc): 128 mg/dL (calc) (ref <130)
Total CHOL/HDL Ratio: 2 (calc) (ref <5.0)
Triglycerides: 86 mg/dL (ref <150)

## 2017-07-10 LAB — TSH: TSH: 2.86 mIU/L (ref 0.40–4.50)

## 2017-07-10 LAB — T4, FREE: FREE T4: 1 ng/dL (ref 0.8–1.8)

## 2017-07-10 MED ORDER — TRAZODONE HCL 50 MG PO TABS: ORAL_TABLET | ORAL | 1 refills | Status: DC

## 2017-07-10 MED ORDER — RANITIDINE HCL 75 MG PO TABS: 75.0000 mg | ORAL_TABLET | Freq: Every day | ORAL | 1 refills | Status: DC | PRN

## 2017-07-10 MED ORDER — ALBUTEROL SULFATE (2.5 MG/3ML) 0.083% IN NEBU: INHALATION_SOLUTION | RESPIRATORY_TRACT | 11 refills | Status: DC

## 2017-07-10 MED ORDER — ALBUTEROL SULFATE HFA 108 (90 BASE) MCG/ACT IN AERS: INHALATION_SPRAY | RESPIRATORY_TRACT | 6 refills | Status: DC

## 2017-07-10 NOTE — Progress Notes (Signed)
   Subjective:    Patient ID: Leslie Drake, female    DOB: [birth year], 66 y.o.   MRN: [id number]  Patient presents for Follow-up (is fasting)   COPD end stage, wearing 2 liters ,Continues to use albuterol 3-4x a day.  She does not leave her house because of the severity of her breathing.  She declined seeing any specialists she only comes in once a year to have her medications refilled.  She continues to smoke about 10 cigarettes a day.    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  Does try to drink Carnation instant breakfast discussed that she can also try Ensure  Review Of Systems:  GEN-+ fatigue, fever, weight loss,weakness, recent illness HEENT- denies eye drainage, change in vision, nasal discharge, CVS- denies chest pain, palpitations RESP- +SOB, cough, wheeze ABD- denies N/V, change in stools, abd pain GU- denies dysuria, hematuria, dribbling, incontinence MSK- denies joint pain, muscle aches, injury Neuro- denies headache, dizziness, syncope, seizure activity       Objective:    BP 132/78   Pulse 100   Temp 98.2 F (36.8 C) (Oral)   Resp (!) 22   Ht 5\' 4"  (1.626 m)   Wt 129 lb (58.5 kg)   SpO2 97% Comment: 2L/min via [place]  BMI 22.14 kg/m  GEN- NAD, alert and oriented x3, chronically ill-appearing HEENT- PERRL, EOMI, non injected sclera, pink conjunctiva, MMM, oropharynx clear Neck- Supple, no thyromegaly CVS- RRR, no murmur RESP-CTAB, good air movement oxygen 2 L ABD-NABS,soft,NT,ND EXT- No edema Pulses- Radial2+        Assessment & Plan:      Problem List Items Addressed This Visit      Unprioritized   Insomnia   Hypothyroidism   Relevant Orders   TSH   T3, free   T4, free   Hyperlipidemia   Relevant Orders   CBC with  Differential/Platelet   Comprehensive metabolic panel   Lipid panel   Tobacco user    She is not interested in tobacco cessation Agreed to have her blood drawn for the year.      Protein-calorie malnutrition ([place])    She does not want any intervention discussed it might try other meal replacements and supplements      Emphysema/COPD ([place]) - Primary    End-stage emphysema COPD she does not want any other interventions.  Her flu shot was given she will be maintained on her current medications and her oxygen therapy.  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 Any transcriptional errors that result from this process are unintentional.

## 2017-07-10 NOTE — Patient Instructions (Signed)
Flu shot given  We will call with lab results  F/U 1 year

## 2017-07-10 NOTE — Assessment & Plan Note (Signed)
She does not want any intervention discussed it might try other meal replacements and supplements

## 2017-07-10 NOTE — Assessment & Plan Note (Signed)
She is not interested in tobacco cessation Agreed to have her blood drawn for the year.

## 2017-07-10 NOTE — Assessment & Plan Note (Signed)
End-stage emphysema COPD she does not want any other interventions.  Her flu shot was given she will be maintained on her current medications and her oxygen therapy.  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

## 2017-10-13 ENCOUNTER — Other Ambulatory Visit: Payer: Self-pay | Admitting: Family Medicine

## 2017-10-19 ENCOUNTER — Other Ambulatory Visit: Payer: Self-pay | Admitting: *Deleted

## 2017-10-19 MED ORDER — ALBUTEROL SULFATE HFA 108 (90 BASE) MCG/ACT IN AERS: INHALATION_SPRAY | RESPIRATORY_TRACT | 0 refills | Status: DC

## 2017-11-01 ENCOUNTER — Other Ambulatory Visit: Payer: Self-pay | Admitting: Family Medicine

## 2017-11-16 ENCOUNTER — Other Ambulatory Visit: Payer: Self-pay | Admitting: Family Medicine

## 2017-11-29 ENCOUNTER — Other Ambulatory Visit: Payer: Self-pay | Admitting: Family Medicine

## 2017-12-11 ENCOUNTER — Other Ambulatory Visit: Payer: Self-pay | Admitting: Family Medicine

## 2017-12-19 ENCOUNTER — Other Ambulatory Visit: Payer: Self-pay | Admitting: Family Medicine

## 2018-01-01 ENCOUNTER — Other Ambulatory Visit: Payer: Self-pay | Admitting: Family Medicine

## 2018-01-06 ENCOUNTER — Other Ambulatory Visit: Payer: Self-pay | Admitting: Family Medicine

## 2018-01-22 ENCOUNTER — Other Ambulatory Visit: Payer: Self-pay | Admitting: Family Medicine

## 2018-02-05 ENCOUNTER — Other Ambulatory Visit: Payer: Self-pay | Admitting: Family Medicine

## 2018-02-13 ENCOUNTER — Other Ambulatory Visit: Payer: Self-pay | Admitting: Family Medicine

## 2018-02-23 ENCOUNTER — Other Ambulatory Visit: Payer: Self-pay | Admitting: Family Medicine

## 2018-03-02 ENCOUNTER — Other Ambulatory Visit: Payer: Self-pay | Admitting: Family Medicine

## 2018-03-12 ENCOUNTER — Other Ambulatory Visit: Payer: Self-pay | Admitting: Family Medicine

## 2018-03-30 ENCOUNTER — Other Ambulatory Visit: Payer: Self-pay | Admitting: Family Medicine

## 2018-04-02 ENCOUNTER — Other Ambulatory Visit: Payer: Self-pay | Admitting: Family Medicine

## 2018-04-10 ENCOUNTER — Other Ambulatory Visit: Payer: Self-pay | Admitting: *Deleted

## 2018-04-10 ENCOUNTER — Telehealth: Payer: Self-pay | Admitting: *Deleted

## 2018-04-10 MED ORDER — ALBUTEROL SULFATE HFA 108 (90 BASE) MCG/ACT IN AERS: INHALATION_SPRAY | RESPIRATORY_TRACT | 6 refills | Status: DC

## 2018-04-10 NOTE — Telephone Encounter (Signed)
Received call from patient spouse.   Reports that patient is using albuterol frequently and is refilling prescription q 2 weeks.   Advised that patient is overusing inhaler. Advised that she can use 8 puffs qd, and supplement with neb tx. Advised too much albuterol can make breathing worse.    Verbalized understanding.   Received call from pharmacy. Advised that patient is refilling medication way too soon. Re-sent prescription with 8 puff max/ day.

## 2018-04-13 MED ORDER — ALBUTEROL SULFATE HFA 108 (90 BASE) MCG/ACT IN AERS: INHALATION_SPRAY | RESPIRATORY_TRACT | 6 refills | Status: DC

## 2018-04-13 MED ORDER — PREDNISONE 20 MG PO TABS: ORAL_TABLET | ORAL | 0 refills | Status: DC

## 2018-04-13 MED ORDER — ALBUTEROL SULFATE (2.5 MG/3ML) 0.083% IN NEBU: INHALATION_SOLUTION | RESPIRATORY_TRACT | 11 refills | Status: DC

## 2018-04-13 NOTE — Telephone Encounter (Signed)
Call placed to patient spouse Jeneen Rinks and he was made aware.   Pharmacy made aware. Prescription sent to pharmacy.   Stressed importance of keeping appointment on 04/25/2018.

## 2018-04-13 NOTE — Addendum Note (Signed)
Addended by: Sheral Flow on: 04/13/2018 04:13 PM   Modules accepted: Orders

## 2018-04-13 NOTE — Telephone Encounter (Signed)
Send in prednisone taper  Refill albuterol nebs She can get her inhaler on the 31st  Spoke with patient she states her breathing is terrible, discussed this multiple times.  She refused to be seen by pulmonary she has refused changes in her medications think that they just do not work.  She states that home all day long she is on oxygen therapy.  I tried to get her in for more visit she states that she just cannot do it so she only comes in once a year.  We tried to get her in earlier after hearing about her overusing her albuterol from the pharmacy she actually canceled early appointment and moved this to December.  She agrees to come in in 2 weeks even though she states this can be very difficult for her.  She agrees to do a prednisone taper since she refused to come in right now or go to the emergency room.  Also advised her to use her nebulizer at least 4 times a day as she likely is not getting in the oxygen using the inhaler hence overuse of her inhaler.  She agrees to do this.  Was previously only using the nebulizer once or twice a day.  Of note see previous notes she and her husband have refused palliative care she has end-stage COPD

## 2018-04-13 NOTE — Telephone Encounter (Signed)
Received call from patent spouse, Jeneen Rinks.   States that with new prescription for albuterol inhaler that was sent to pharmacy, patient can only fill prescription q 25 days. States that patient is going through an inhaler q 12 days.   Advised that she is using prescription too frequently. Advised that if patient is having that much difficulty breathing, she needs to be seen ASAP.   Attempted to call patient. No answer, no VM. Patient spouse made aware.   Of note, during last conversation with spouse, yearly appointment was moved to August 2019 d/t PCP requiring time off. Patient called back and moved appointment again to Dec 2019.

## 2018-04-25 ENCOUNTER — Ambulatory Visit (INDEPENDENT_AMBULATORY_CARE_PROVIDER_SITE_OTHER): Payer: Medicare Other | Admitting: Family Medicine

## 2018-04-25 ENCOUNTER — Other Ambulatory Visit: Payer: Self-pay

## 2018-04-25 ENCOUNTER — Encounter: Payer: Self-pay | Admitting: Family Medicine

## 2018-04-25 VITALS — BP 122/80 | HR 105 | Temp 98.3°F | Resp 20

## 2018-04-25 DIAGNOSIS — J432 Centrilobular emphysema: Secondary | ICD-10-CM | POA: Diagnosis not present

## 2018-04-25 DIAGNOSIS — R2681 Unsteadiness on feet: Secondary | ICD-10-CM | POA: Diagnosis not present

## 2018-04-25 DIAGNOSIS — IMO0001 Reserved for inherently not codable concepts without codable children: Secondary | ICD-10-CM

## 2018-04-25 DIAGNOSIS — F1020 Alcohol dependence, uncomplicated: Secondary | ICD-10-CM | POA: Diagnosis not present

## 2018-04-25 DIAGNOSIS — R531 Weakness: Secondary | ICD-10-CM | POA: Diagnosis not present

## 2018-04-25 DIAGNOSIS — G4700 Insomnia, unspecified: Secondary | ICD-10-CM

## 2018-04-25 DIAGNOSIS — E440 Moderate protein-calorie malnutrition: Secondary | ICD-10-CM

## 2018-04-25 MED ORDER — BUDESONIDE 0.5 MG/2ML IN SUSP: 0.5000 mg | Freq: Two times a day (BID) | RESPIRATORY_TRACT | 2 refills | Status: AC

## 2018-04-25 MED ORDER — TRAZODONE HCL 50 MG PO TABS: ORAL_TABLET | ORAL | 2 refills | Status: AC

## 2018-04-25 MED ORDER — ALBUTEROL SULFATE (2.5 MG/3ML) 0.083% IN NEBU
INHALATION_SOLUTION | RESPIRATORY_TRACT | 11 refills | Status: DC
Start: 2018-04-25 — End: 2019-05-02

## 2018-04-25 NOTE — Progress Notes (Signed)
Subjective:    Patient ID: Leslie Drake, female    DOB: 05/05/1951, 67 y.o.   MRN: [id number]  Patient presents for Follow-up (is not fasting)  Pt here for due to end stage COPD/emphysema, alcohol abuse. She declines coming to the office more than once a year. She is palliative care elgible but she declines She has a nurse that comes to the home to help with ADLS/Bath three times a week. Has RN once a month Pharmacy alerted [place], see previous phone notes she had been going through albuterol inhalers every 10 days. She has not been using the nebulizer but puffing every 2 hours or so. She was given prednisone, states it helped her breathing but she hates the medicine, it made her jittery, she had constipation, nausea , sore throat, that is now resolved.  She does not want to see a lung specialist Her husband is also here today   Asked for motorized wheelchair, so weak unable to manipulate a manual chair Home is on 1 floor, feels motorized chair will help with herADLS, such as getting to bathroom, kitchen  SHe is unable to use a cane or walker to meet her mobility needs secondary to progressive weakness and deconditioning.   A manual wheelchair is occasionally used  but requires the assitance of family members to operate due to his weakness and some decreased range of motions in his upper extremities and Lower extremeties    Pt is unable to use a scooter. SHe has decreased upper extremity strength and generalized weakness.   -  -- SHe is unable to use a motorized scooter, unable to use tiller system, instability of trunk and upper extremeties  No recent falls   Continues to drink at least 2-3 glasses of wine every night, despite very high LFT, she declines labs today and work up   Discussed hospital bed, she declines this  Review Of Systems:  GEN- + fatigue, fever, weight loss,weakness, recent illness HEENT- denies eye drainage, change in vision, nasal discharge, CVS- denies  chest pain, palpitations RESP- +SOB,+ cough, +wheeze ABD- denies N/V, change in stools, abd pain GU- denies dysuria, hematuria, dribbling, incontinence MSK- denies joint pain, muscle aches, injury Neuro- denies headache, dizziness, syncope, seizure activity       Objective:    BP 122/80   Pulse (!) 105   Temp 98.3 F (36.8 C) (Oral)   Resp 20   SpO2 95%  GEN- NAD, alert and oriented x3,sitting in wheelchair, chronically ill appearing HEENT- PERRL, EOMI, non injected sclera, pink conjunctiva, MMM, oropharynx clear Neck- Supple, no LAD CVS- RRR, no murmur RESP-decreased at bases, no wheeze, fair WOB, tires easily, wearing 2L ABD-NABS,soft,NT,ND NEURO-CNII-XII in tact, decreased strength Upper ext and lower ext 4/5,unable to stand unassisted, sits up but leans to the side some, decreased tone overall, loss of muscle mass EXT- No edema Pulses- Radial 2+        Assessment & Plan:      Problem List Items Addressed This Visit      Unprioritized   Alcoholism /alcohol abuse ([place])    She has cut back some, but still drinking too much  She does not want to quit      Emphysema/COPD ([place]) - Primary    Will try her on pulmicort nebs, she agrees to this, we have tried multiple other inhalers, brovana, she declines pulmonary  Does not want to come in more than once a year Declines labs Discussed proper use of albuterol,  can use neb if difficulty breathing       Relevant Medications   budesonide (PULMICORT) 0.5 MG/2ML nebulizer solution   albuterol (PROVENTIL) (2.5 MG/3ML) 0.083% nebulizer solution   Gait instability    She  would benefit from a power wheelchair  as  she is able to sit upright and utilize the joystick.   She is mentally able to use the power wheelchair [facility] is motivated and willing to use a power wheelchair.  Using power wheelchair will significantly improve her mobility with regards to ADLS and make her more independent      Generalized weakness   Insomnia     Continue trazodone       Protein-calorie malnutrition ([place])      Note: This dictation was prepared with Dragon dictation along with smaller phrase technology. Any transcriptional errors that result from this process are unintentional.

## 2018-04-25 NOTE — Patient Instructions (Signed)
Motorized wheelchair I will call Leslie Drake - about flu shot Pulmicort nebs twice a day  Continue albuterol as needed  Refilled trazodone /albuterol Increase protein in your diet  F/U 1 year

## 2018-04-29 ENCOUNTER — Encounter: Payer: Self-pay | Admitting: Family Medicine

## 2018-04-29 DIAGNOSIS — R2681 Unsteadiness on feet: Secondary | ICD-10-CM | POA: Insufficient documentation

## 2018-04-29 DIAGNOSIS — R531 Weakness: Secondary | ICD-10-CM | POA: Insufficient documentation

## 2018-04-29 NOTE — Assessment & Plan Note (Signed)
She has cut back some, but still drinking too much  She does not want to quit

## 2018-04-29 NOTE — Assessment & Plan Note (Signed)
Continue trazodone 

## 2018-04-29 NOTE — Assessment & Plan Note (Signed)
Will try her on pulmicort nebs, she agrees to this, we have tried multiple other inhalers, brovana, she declines pulmonary  Does not want to come in more than once a year Declines labs Discussed proper use of albuterol, can use neb if difficulty breathing

## 2018-04-29 NOTE — Assessment & Plan Note (Signed)
She  would benefit from a power wheelchair  as she is able to sit upright and utilize the joystick.   She is mentally able to use the power wheelchair [facility] is motivated and willing to use a power wheelchair.  Using power wheelchair will significantly improve her mobility with regards to ADLS and make her more independent

## 2018-05-18 ENCOUNTER — Ambulatory Visit: Payer: Self-pay | Admitting: Family Medicine

## 2018-07-10 ENCOUNTER — Ambulatory Visit: Payer: Medicare Other | Admitting: Family Medicine

## 2018-08-13 ENCOUNTER — Other Ambulatory Visit: Payer: Self-pay | Admitting: Family Medicine

## 2018-08-24 ENCOUNTER — Ambulatory Visit: Payer: Medicare Other | Admitting: Family Medicine

## 2018-09-29 ENCOUNTER — Other Ambulatory Visit: Payer: Self-pay | Admitting: Family Medicine

## 2018-11-02 ENCOUNTER — Other Ambulatory Visit: Payer: Self-pay | Admitting: Family Medicine

## 2018-12-10 ENCOUNTER — Other Ambulatory Visit: Payer: Self-pay | Admitting: Family Medicine

## 2019-01-11 ENCOUNTER — Other Ambulatory Visit: Payer: Self-pay | Admitting: Family Medicine

## 2019-02-25 ENCOUNTER — Other Ambulatory Visit: Payer: Self-pay | Admitting: Family Medicine

## 2019-02-25 ENCOUNTER — Telehealth: Payer: Self-pay | Admitting: *Deleted

## 2019-02-25 NOTE — Telephone Encounter (Signed)
I will do curbside visit, see if I can talk to her face to face and get her to understand her options of hospice/palliative

## 2019-02-25 NOTE — Telephone Encounter (Signed)
Received call from patient spouse, Leslie Drake. [phone]~ telephone.   Reports that patient has deconditioned and he feels that her health is failing. States that she is barely eating, has no energy, and breathing is deteriorating. States that he does not feel that she will be able to come to office for visit.   Inquired as to how her care should be managed at this time. Advised that there are options. Advised that he can look into Hospice or palliative care services.  Advised that he can also look into other offices that may perform house calls.   Advised that patient needs to be seen at least yearly to continue care. Inquired as to if appointment could be made for patient to be seen curbside.   MD please advise.

## 2019-02-25 NOTE — Telephone Encounter (Signed)
Call placed to patient and patient spouse made aware.  Inquired as to if we can move appointment to fall. Advised MD wanted appointment to be made sooner. Will contact patient wife to move appointment.  Also inquired as to if we could try generic ProAir. Prescription sent to pharmacy.

## 2019-02-26 NOTE — Telephone Encounter (Signed)
Call placed to patient.   States that she would like to push appointment out until Oct.   Appointment scheduled for 07/01/2019.

## 2019-03-14 ENCOUNTER — Other Ambulatory Visit: Payer: Self-pay | Admitting: Family Medicine

## 2019-04-29 ENCOUNTER — Ambulatory Visit: Payer: Medicare Other | Admitting: Family Medicine

## 2019-05-02 ENCOUNTER — Other Ambulatory Visit: Payer: Self-pay | Admitting: Family Medicine

## 2019-07-01 ENCOUNTER — Ambulatory Visit: Payer: Self-pay | Admitting: Family Medicine

## 2019-07-01 ENCOUNTER — Other Ambulatory Visit: Payer: Self-pay | Admitting: Family Medicine

## 2019-07-08 DIAGNOSIS — F17219 Nicotine dependence, cigarettes, with unspecified nicotine-induced disorders: Secondary | ICD-10-CM | POA: Diagnosis not present

## 2019-07-08 DIAGNOSIS — J431 Panlobular emphysema: Secondary | ICD-10-CM | POA: Diagnosis not present

## 2019-07-08 DIAGNOSIS — R531 Weakness: Secondary | ICD-10-CM | POA: Diagnosis not present

## 2019-07-18 DIAGNOSIS — Z79899 Other long term (current) drug therapy: Secondary | ICD-10-CM | POA: Diagnosis not present

## 2019-08-08 DIAGNOSIS — Z23 Encounter for immunization: Secondary | ICD-10-CM | POA: Diagnosis not present

## 2020-06-11 DIAGNOSIS — J431 Panlobular emphysema: Secondary | ICD-10-CM | POA: Diagnosis not present

## 2020-06-11 DIAGNOSIS — R531 Weakness: Secondary | ICD-10-CM | POA: Diagnosis not present

## 2020-06-11 DIAGNOSIS — F17219 Nicotine dependence, cigarettes, with unspecified nicotine-induced disorders: Secondary | ICD-10-CM | POA: Diagnosis not present

## 2021-04-19 DEATH — deceased

## 2021-04-30 ENCOUNTER — Telehealth: Payer: Self-pay | Admitting: Family Medicine

## 2021-04-30 NOTE — Telephone Encounter (Signed)
Spouse called in states patient has passed away in her sleep at home on 04/01/2021. He has requested medical records as her estate representative. He is going to dispute what the medical examiner put on the death certificate of having COPD for 20 years. He states that he recalls her getting dx with that in 2015. He is requesting medical records from Dr. Buelah Manis from 2013- present.   CB# [phone]
